# Patient Record
Sex: Male | Born: 1970 | Race: White
Health system: Northeastern US, Academic
[De-identification: ages and names within clinical notes are randomized; demographics above are authoritative.]

---

## 2012-11-08 ENCOUNTER — Ambulatory Visit
Admission: AD | Admit: 2012-11-08 | Discharge: 2012-11-08 | Disposition: A | Discharge status: Home / Self Care | Payer: Self-pay

## 2012-11-08 DIAGNOSIS — K047 Periapical abscess without sinus: Secondary | ICD-10-CM

## 2012-11-08 LAB — HM HIV SCREENING OFFERED

## 2012-11-08 MED ORDER — AMOXICILLIN 500 MG PO CAPS *I*
500.0000 mg | ORAL_CAPSULE | Freq: Three times a day (TID) | ORAL | Status: DC
Start: 2012-11-08 — End: 2013-06-26

## 2012-11-08 MED ORDER — AMOXICILLIN 500 MG PO CAPS *I*
500.0000 mg | ORAL_CAPSULE | Freq: Three times a day (TID) | ORAL | Status: DC
Start: 2012-11-08 — End: 2012-11-08

## 2012-11-08 NOTE — Discharge Instructions (Signed)
Ibuprofen 200 mg, 2-3 tabs every 6 hours with food  Go to [facility] in the morning to be seen in the dental urgent care.  Or you may Go to the [facility] at [facility] tomorrow morning to be seen as a walk in patient  Or you may [facility] tomorrow morning to be seen as a walk in patient  Use peridex solution twice daily, 1 tablespoon, swish in mouth for 30 second and spit.  Take antibiotics as prescribed until gone

## 2012-11-08 NOTE — ED Notes (Signed)
Pt with broken tooth and ? Infection right upper side, pt states yesterday started with facial swelling

## 2012-11-08 NOTE — UC Provider Note (Signed)
History     Chief Complaint   Patient presents with   . Facial Swelling     pt with right side facial swelling started yesterday,  pt with dental pain and infection from broken tooth upper right side      HPI Comments: Pt has right upper tooth infection and this AM awoke with right facial swelling. He has been taking ibuprofen for this  At times but has minimal discomfort now      History provided by:  Patient      History reviewed. No pertinent past medical history.         History reviewed. No pertinent past surgical history.    History reviewed. No pertinent family history.      Social History      reports that he has been smoking Cigarettes.  He has been smoking about 1.00 pack per day. He has never used smokeless tobacco. He reports that he currently engages in sexual activity. He reports that he does not drink alcohol or use illicit drugs.    Living Situation    Questions Responses    Patient lives with Significant Other    Homeless No    Caregiver for other family member No    External Services None    Employment Employed    Domestic Violence Risk No          Review of Systems   Review of Systems   HENT: Positive for facial swelling and dental problem.        Physical Exam     ED Triage Vitals   BP Heart Rate Heart Rate(via Pulse Ox) Resp Temp Temp src SpO2 O2 Device O2 Flow Rate   11/08/12 0904 11/08/12 0904 -- 11/08/12 0904 11/08/12 0904 -- 11/08/12 0904 -- --   118/74 mmHg 81  18 36.8 C (98.2 F)  99 %        Weight           11/08/12 0904           70.308 kg (155 lb)               Physical Exam   Constitutional: He is oriented to person, place, and time. He appears well-developed and well-nourished.   HENT:   Head: Normocephalic.   Mouth/Throat: Abnormal dentition. Dental abscesses and dental caries present. No edematous.       Eyes: EOM are normal. Pupils are equal, round, and reactive to light.   Neck: Neck supple.   Neurological: He is alert and oriented to person, place, and time.   Skin: Skin is  warm and dry.       Medical Decision Making   <EDMDM>    Initial Evaluation:  ED First Provider Contact    Date/Time Event User Comments    11/08/12 [phone] ED Provider First Contact Excell Seltzer Initial Face to Face Provider Contact          Patient seen by me as above    Assessment:  42 y.o., male comes to the Urgent Care Center with right facial swelling from dental abscess and Fx tooth #4    Differential Diagnosis includes Abscess tooth, facial cellulitis              Plan: Doxycycline, peridex, dental f/u      Excell Seltzer, PA

## 2013-06-26 ENCOUNTER — Encounter: Payer: Self-pay | Admitting: Gastroenterology

## 2013-06-26 ENCOUNTER — Other Ambulatory Visit: Payer: Self-pay | Admitting: Gastroenterology

## 2013-06-26 ENCOUNTER — Inpatient Hospital Stay
Admit: 2013-06-26 | Discharge status: Against Medical Advice | Payer: Self-pay | Source: Ambulatory Visit | Admitting: Emergency Medicine

## 2013-06-26 ENCOUNTER — Emergency Department
Admit: 2013-06-26 | Discharge: 2013-06-26 | Disposition: A | Discharge status: Other Acute Inpatient Hospital | Payer: Self-pay | Source: Ambulatory Visit | Attending: Emergency Medicine | Admitting: Emergency Medicine

## 2013-06-26 ENCOUNTER — Encounter: Payer: Self-pay | Admitting: Emergency Medicine

## 2013-06-26 DIAGNOSIS — R079 Chest pain, unspecified: Secondary | ICD-10-CM

## 2013-06-26 DIAGNOSIS — Z720 Tobacco use: Secondary | ICD-10-CM | POA: Diagnosis present

## 2013-06-26 LAB — COMPREHENSIVE METABOLIC PANEL
ALT: 11 U/L (ref 0–50)
AST: 17 U/L (ref 0–50)
Albumin: 4.8 g/dL (ref 3.5–5.2)
Alk Phos: 91 U/L (ref 40–130)
Anion Gap: 11 (ref 7–16)
Bilirubin,Total: 0.2 mg/dL (ref 0.0–1.2)
CO2: 27 mmol/L (ref 20–28)
Calcium: 9.9 mg/dL (ref 9.0–10.3)
Chloride: 103 mmol/L (ref 96–108)
Creatinine: 0.8 mg/dL (ref 0.67–1.17)
GFR,Black: 127 *
GFR,Caucasian: 110 *
Glucose: 88 mg/dL (ref 60–99)
Lab: 14 mg/dL (ref 6–20)
Potassium: 4.3 mmol/L (ref 3.3–5.1)
Sodium: 141 mmol/L (ref 133–145)
Total Protein: 7 g/dL (ref 6.3–7.7)

## 2013-06-26 LAB — CK ISOENZYMES
CK: 166 U/L (ref 46–171)
Mass CKMB: 2.9 ng/mL (ref 0.0–4.9)

## 2013-06-26 LAB — CBC AND DIFFERENTIAL
Baso # K/uL: 0 10*3/uL (ref 0.0–0.1)
Basophil %: 0.6 % (ref 0.2–1.2)
Eos # K/uL: 0.2 10*3/uL (ref 0.0–0.5)
Eosinophil %: 2.7 % (ref 0.8–7.0)
Hematocrit: 45 % (ref 40–51)
Hemoglobin: 15.5 g/dL (ref 13.7–17.5)
Lymph # K/uL: 1.8 10*3/uL (ref 1.3–3.6)
Lymphocyte %: 25.4 % (ref 21.8–53.1)
MCH: 31 pg/cell (ref 26–32)
MCHC: 35 g/dL (ref 32–37)
MCV: 91 fL (ref 79–92)
Mono # K/uL: 0.6 10*3/uL (ref 0.3–0.8)
Monocyte %: 9.1 % (ref 5.3–12.2)
Neut # K/uL: 4.4 10*3/uL (ref 1.8–5.4)
Platelets: 201 10*3/uL (ref 150–330)
RBC: 4.9 MIL/uL (ref 4.6–6.1)
RDW: 13.9 % (ref 11.6–14.4)
Seg Neut %: 62.2 % (ref 34.0–67.9)
WBC: 7.1 10*3/uL (ref 4.2–9.1)

## 2013-06-26 LAB — D-DIMER, QUANTITATIVE: D-Dimer: <0.22 ug/mL FEU (ref 0.00–0.50)

## 2013-06-26 LAB — TROPONIN T
Troponin T: <0.01 ng/mL (ref 0.00–0.02)
Troponin T: <0.01 ng/mL (ref 0.00–0.02)

## 2013-06-26 LAB — HM HIV SCREENING OFFERED

## 2013-06-26 MED ORDER — IBUPROFEN 600 MG PO TABS *I*
600.0000 mg | ORAL_TABLET | Freq: Four times a day (QID) | ORAL | Status: AC | PRN
Start: 2013-06-26 — End: 2013-07-01

## 2013-06-26 MED ORDER — NICOTINE POLACRILEX 2 MG MT LOZG *I*
2.0000 mg | LOZENGE | OROMUCOSAL | Status: AC | PRN
Start: 2013-06-26 — End: 2013-07-11

## 2013-06-26 MED ORDER — ASPIRIN 81 MG PO CHEW *I*
324.0000 mg | CHEWABLE_TABLET | Freq: Once | ORAL | Status: AC
Start: 2013-06-26 — End: 2013-06-26
  Administered 2013-06-26: 324 mg via ORAL
  Filled 2013-06-26: qty 4

## 2013-06-26 MED ORDER — IBUPROFEN 600 MG PO TABS *I*
600.0000 mg | ORAL_TABLET | Freq: Four times a day (QID) | ORAL | Status: DC | PRN
Start: 2013-06-26 — End: 2013-06-27

## 2013-06-26 MED ORDER — NICOTINE POLACRILEX 2 MG MT LOZG *I*
2.0000 mg | LOZENGE | OROMUCOSAL | Status: DC | PRN
Start: 2013-06-26 — End: 2013-06-27

## 2013-06-26 MED ORDER — ACETAMINOPHEN 325 MG PO TABS *I*
650.0000 mg | ORAL_TABLET | ORAL | Status: DC | PRN
Start: 2013-06-26 — End: 2013-06-27

## 2013-06-26 MED ORDER — KETOROLAC TROMETHAMINE 30 MG/ML IJ SOLN *I*
30.0000 mg | Freq: Once | INTRAMUSCULAR | Status: AC
Start: 2013-06-26 — End: 2013-06-26
  Administered 2013-06-26: 30 mg via INTRAVENOUS
  Filled 2013-06-26: qty 1

## 2013-06-26 NOTE — ED Notes (Signed)
Pt arrived in [facility] from [facility] via stretcher.  A&Ox4.  No difficulties ambulating from stretcher to bed.  Wife is at bedside.  Oriented to unit, call bell within place.

## 2013-06-26 NOTE — ED Notes (Signed)
Plan of Care     Serial troponin and EKG, telemetry monitoring, labs, vitals, daily medications, comfort care

## 2013-06-26 NOTE — ED Notes (Signed)
Plan of Care     Serial trop/ekgs, telemetry monitoring, medications as prescribed, activity as tolerated, comfort measures as needed.

## 2013-06-26 NOTE — Discharge Instructions (Signed)
You did not have a heart attack  Your pain is likely related to muscle sprain   Take ibuprofen as needed   You are scheduled for a stress test of your heart on Monday at [facility] [address]   See an eye doctor in regard to your vision in follow up  Try not to smoke  Use the nicorette lozenges as needed  Return if worse or change

## 2013-06-26 NOTE — ED Notes (Signed)
Plan: monitor, medication administration/labs/imaging per provider order, comfort measures, teaching, VS q4hr, and reassess.

## 2013-06-26 NOTE — ED Notes (Signed)
Pt presents with left sided chest pain x one week but today started with left arm pain started one hour ago. denies cardiac history. denies SOB/diaphoresis

## 2013-06-26 NOTE — ED Provider Notes (Signed)
History     Chief Complaint   Patient presents with    Chest Pain     Pt presents with left sided chest pain x one week but today started with left arm pain started one hour ago. denies cardiac history. denies SOB/diaphoresis     HPI Comments: 43 year old gentleman with about a week off chest discomfort on the left side, comes and goes but became more constant few days ago.  Described as 5/10 currently radiating into the left arm since about an hour ago.  Mild no nausea, does get reproduced with deep breath and palpation of the chest, however patient denies any injury or trauma.  It is described as sharp.  The pain in the arm is achy.  Not associated with any arm movement.  No loss of sensation or power.  No shortness of breath no diarrhea no vomiting and no diaphoresis.  Good fluids otherwise.  No abdominal pain.  No calf swelling or tenderness.    Patient does smoke and has a family history of heart troubles in the 3s with his father.  Does not have a primary care physician but according to the patient has been in good health otherwise.  No history of diabetes, hypertension or cholesterol or other conditions according to the patient.      History reviewed. No pertinent past medical history.         History reviewed. No pertinent past surgical history.    History reviewed. No pertinent family history.      Social History      reports that he has been smoking Cigarettes.  He has been smoking about 1.00 pack per day. He has never used smokeless tobacco. He reports that he currently engages in sexual activity. He reports that he does not drink alcohol or use illicit drugs.    Living Situation    Questions Responses    Patient lives with Significant Other    Homeless No    Caregiver for other family member No    External Services None    Employment Employed    Domestic Violence Risk No          Problem List     There is no problem list on file for this patient.      Review of Systems   Review of Systems    Constitutional: Negative for fever, chills and unexpected weight change.   HENT: Negative for nosebleeds, neck pain and neck stiffness.    Eyes: Negative for photophobia and pain.   Respiratory: Negative for cough, chest tightness, shortness of breath, wheezing and stridor.    Cardiovascular: Positive for chest pain. Negative for palpitations and leg swelling.        See HPI   Gastrointestinal: Positive for nausea. Negative for vomiting, abdominal pain and blood in stool.   Genitourinary: Negative for frequency, flank pain, difficulty urinating and testicular pain.   Musculoskeletal: Negative for myalgias and arthralgias.        See HPI   Skin: Negative for pallor.   Neurological: Positive for dizziness (generalized lightheaded at times throughout the week.). Negative for seizures, syncope, weakness, light-headedness and headaches.   Psychiatric/Behavioral: Negative for suicidal ideas, behavioral problems and self-injury.   All other systems reviewed and are negative.        Physical Exam     ED Triage Vitals   BP Pulse Heart Rate(via Pulse Ox) Resp Temp Temp src SpO2 O2 Device O2 Flow Rate   06/26/13 1114 --  06/26/13 1114 06/26/13 1114 06/26/13 1114 -- 06/26/13 1114 06/26/13 1114 --   156/82 mmHg  81 20 36.4 C (97.5 F)  98 % None (Room air)       Weight           06/26/13 1114           63.504 kg (140 lb)               Physical Exam   Nursing note and vitals reviewed.  Constitutional: He is oriented to person, place, and time. He appears well-developed and well-nourished. He appears distressed (mild distress, worse and reproduced with palpation of the left chest wall with the same.).   HENT:   Head: Normocephalic and atraumatic.   Eyes: Pupils are equal, round, and reactive to light.   Neck: Normal range of motion. Neck supple.   Cardiovascular: Normal rate, regular rhythm and normal heart sounds.    Pulmonary/Chest: Effort normal and breath sounds normal. No respiratory distress. He has no wheezes. He has no  rales. He exhibits tenderness (as above, reproducible sharp chest wall tenderness on the left side.).   Abdominal: Soft. Bowel sounds are normal. There is no tenderness.   Musculoskeletal: Normal range of motion. He exhibits no edema and no tenderness.   No calf tender/swelling   Neurological: He is alert and oriented to person, place, and time. No cranial nerve deficit.   No focal neurological deficits   Skin: Skin is warm and dry.   Psychiatric: He has a normal mood and affect. Judgment normal.       Medical Decision Making   <EDMDM>  EKG with normal sinus rhythm, right bundle branch block at 83.  No other acute changes.  Recent Results (from the past 24 hour(s))   HM HIV SCREENING OFFERED    Collection Time     06/26/13 12:00 AM       Result Value Range    HM HIV SCREENING OFFERED Declined     CBC AND DIFFERENTIAL    Collection Time     06/26/13 11:39 AM       Result Value Range    WBC 7.1  4.2 - 9.1 THOU/uL    RBC 4.9  4.6 - 6.1 MIL/uL    Hemoglobin 15.5  13.7 - 17.5 g/dL    Hematocrit 45  40 - 51 %    MCV 91  79 - 92 fL    MCH 31  26 - 32 pg/cell    MCHC 35  32 - 37 g/dL    RDW 13.9  11.6 - 14.4 %    Platelets 201  150 - 330 THOU/uL    Seg Neut % 62.2  34.0 - 67.9 %    Lymphocyte % 25.4  21.8 - 53.1 %    Monocyte % 9.1  5.3 - 12.2 %    Eosinophil % 2.7  0.8 - 7.0 %    Basophil % 0.6  0.2 - 1.2 %    Neut # K/uL 4.4  1.8 - 5.4 THOU/uL    Lymph # K/uL 1.8  1.3 - 3.6 THOU/uL    Mono # K/uL 0.6  0.3 - 0.8 THOU/uL    Eos # K/uL 0.2  0.0 - 0.5 THOU/uL    Baso # K/uL 0.0  0.0 - 0.1 THOU/uL   COMPREHENSIVE METABOLIC PANEL    Collection Time     06/26/13 11:39 AM       Result Value Range  Sodium 141  133 - 145 mmol/L    Potassium 4.3  3.3 - 5.1 mmol/L    Chloride 103  96 - 108 mmol/L    CO2 27  20 - 28 mmol/L    Anion Gap 11  7 - 16    UN 14  6 - 20 mg/dL    Creatinine 0.80  0.67 - 1.17 mg/dL    GFR,Caucasian 110      GFR,Black 127      Glucose 88  60 - 99 mg/dL    Calcium 9.9  9.0 - 10.3 mg/dL    Total Protein 7.0   6.3 - 7.7 g/dL    Albumin 4.8  3.5 - 5.2 g/dL    Bilirubin,Total 0.2  0.0 - 1.2 mg/dL    AST 17  0 - 50 U/L    ALT 11  0 - 50 U/L    Alk Phos 91  40 - 130 U/L   TROPONIN T    Collection Time     06/26/13 11:39 AM       Result Value Range    Troponin T <0.01  0.00 - 0.02 ng/mL   CK ISOENZYMES    Collection Time     06/26/13 11:39 AM       Result Value Range    CK 166  46 - 171 U/L    Mass CKMB 2.9  0.0 - 4.9 ng/mL   D-DIMER, QUANTITATIVE    Collection Time     06/26/13 11:39 AM       Result Value Range    D-Dimer <0.22  0.00 - 0.50 ug/mL FEU     Preliminary chest x-ray read as negative  Initial Evaluation:  ED First Provider Contact    Date/Time Event User Comments    06/26/13 1126 ED Provider First Contact Askari Kinley, [facility] Initial Face to Face Provider Contact          Patient seen by me as above    Assessment:  43 y.o., male comes to the ED with atypical chest pain, reproducible however smoker and family history with young age.  No previous cardiac workup.    Differential Diagnosis includes consideration for acute cardiac syndrome.  Consideration for unlikely pneumonia, consideration for pulmonary embolus as well as other causes of chest pain.  Unlikely epigastric such as reflux or ulcer.               Plan: In the emergency room started with aspirin and Toradol.  Feeling better with medication.  Discussed with patient regarding the options including observation in one of the hospitals.  Also discussed about the option of a second troponin.  Including pros and cons and that the complete workup is in observation.    Discussed with patient as well as Dr. Gerrit Halls at [facility] and patient accepted for observation.  At 1 PM.      Massie Kluver, MD          Massie Kluver, MD  06/26/13 [phone]

## 2013-06-26 NOTE — Progress Notes (Signed)
Utilization Management    Level of Care Observation service as of the date 06/26/2013    Pt notified of Observation level of care.  Copy of notification given to patient and copy placed in chartlet.      Marcus KaufmanAlyssa Xena Montoya     Pager: [phone]

## 2013-06-26 NOTE — ED Obs Notes (Addendum)
ED OBSERVATION ADMISSION NOTE    Patient seen by me today, 06/26/2013 at 2:32 PM    Current patient status: Observation    History   No chief complaint on file.    Patient is a 43 y.o. male presenting with chest pain.   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  Chest Pain  Pain location:  Substernal area  Pain quality: pressure    Pain radiates to the back: no    Pain severity:  Moderate  Duration:  7 days  Timing:  Constant  Progression:  Waxing and waning  Chronicity:  New  Relieved by:  Nothing  Worsened by:  Nothing tried  Ineffective treatments:  None tried  Associated symptoms: no abdominal pain, no anorexia, no anxiety, no back pain, no cough, no diaphoresis, no fever, no nausea, no near-syncope, no numbness, no orthopnea, no palpitations, no shortness of breath, no syncope and not vomiting    Risk factors: male sex and smoking    States that he does a lot of heavy lifting at work but can not note a specific example     No past medical history on file.    No past surgical history on file.    No family history on file.    Social History      reports that he has been smoking Cigarettes.  He has been smoking about 1.00 pack per day. He has never used smokeless tobacco. He reports that he currently engages in sexual activity. He reports that he does not drink alcohol or use illicit drugs.    Living Situation    Questions Responses    Patient lives with Significant Other    Homeless No    Caregiver for other family member No    External Services None    Employment Employed    Domestic Violence Risk No          Review of Systems   Review of Systems   Constitutional: Negative for fever and diaphoresis.   Respiratory: Negative for cough and shortness of breath.    Cardiovascular: Positive for chest pain. Negative for palpitations, orthopnea, syncope and near-syncope.   Gastrointestinal: Negative for nausea, vomiting,  abdominal pain and anorexia.   Musculoskeletal: Negative for back pain.   Neurological: Negative for numbness.       Physical Exam   There were no vitals taken for this visit.    Physical Exam   Constitutional: He is oriented to person, place, and time. He appears well-developed and well-nourished.   HENT:   Head: Normocephalic.   Neck: Neck supple.   Cardiovascular: Normal rate, regular rhythm and normal heart sounds.  Exam reveals no gallop and no friction rub.    No murmur heard.  Pulmonary/Chest: Effort normal and breath sounds normal.   Abdominal: Soft. He exhibits no distension. There is no tenderness.   Musculoskeletal: Normal range of motion. He exhibits no edema.   Neurological: He is alert and oriented to person, place, and time.   Skin: Skin is warm and dry.   Psychiatric: He has a normal mood and affect. His behavior is normal. Judgment and thought content normal.     Heent: pupils equal with visual acuity grossly intact  perl bilateral   Fundi without abnormalities seen  Neck: without jvd   Heart: reg s1 s2   Lungs: clear abdomen: soft  ext: without edema   Chest with palpatory discomfort midsternal  Slight posterior chest wall discomfort as well     Tests    EKG:  Nsr    rbbb poor r progression ivcd otherwise nonspecific     Labs:   All labs in the last 24 hours:   Recent Results (from the past 24 hour(s))   HM HIV SCREENING OFFERED    Collection Time     06/26/13 12:00 AM       Result Value Range    HM HIV SCREENING OFFERED Declined     CBC AND DIFFERENTIAL    Collection Time     06/26/13 11:39 AM       Result Value Range    WBC 7.1  4.2 - 9.1 THOU/uL    RBC 4.9  4.6 - 6.1 MIL/uL    Hemoglobin 15.5  13.7 - 17.5 g/dL    Hematocrit 45  40 - 51 %    MCV 91  79 - 92 fL    MCH 31  26 - 32 pg/cell    MCHC 35  32 - 37 g/dL    RDW 13.9  11.6 - 14.4 %    Platelets 201  150 - 330 THOU/uL    Seg Neut % 62.2  34.0 - 67.9 %    Lymphocyte % 25.4  21.8 - 53.1 %    Monocyte % 9.1  5.3 - 12.2 %    Eosinophil % 2.7  0.8  - 7.0 %    Basophil % 0.6  0.2 - 1.2 %    Neut # K/uL 4.4  1.8 - 5.4 THOU/uL    Lymph # K/uL 1.8  1.3 - 3.6 THOU/uL    Mono # K/uL 0.6  0.3 - 0.8 THOU/uL    Eos # K/uL 0.2  0.0 - 0.5 THOU/uL    Baso # K/uL 0.0  0.0 - 0.1 THOU/uL   COMPREHENSIVE METABOLIC PANEL    Collection Time     06/26/13 11:39 AM       Result Value Range    Sodium 141  133 - 145 mmol/L    Potassium 4.3  3.3 - 5.1 mmol/L    Chloride 103  96 - 108 mmol/L    CO2 27  20 - 28 mmol/L    Anion Gap 11  7 - 16    UN 14  6 - 20 mg/dL    Creatinine 0.80  0.67 - 1.17 mg/dL    GFR,Caucasian 110      GFR,Black 127      Glucose 88  60 - 99 mg/dL    Calcium 9.9  9.0 - 10.3 mg/dL    Total Protein 7.0  6.3 - 7.7 g/dL    Albumin 4.8  3.5 - 5.2 g/dL    Bilirubin,Total 0.2  0.0 - 1.2 mg/dL    AST 17  0 - 50 U/L    ALT 11  0 - 50 U/L    Alk Phos 91  40 - 130 U/L   TROPONIN T    Collection Time     06/26/13 11:39 AM       Result Value Range    Troponin T <0.01  0.00 - 0.02 ng/mL   CK ISOENZYMES    Collection Time     06/26/13 11:39 AM       Result Value Range    CK 166  46 - 171 U/L    Mass CKMB  2.9  0.0 - 4.9 ng/mL   D-DIMER, QUANTITATIVE    Collection Time     06/26/13 11:39 AM       Result Value Range    D-Dimer <0.22  0.00 - 0.50 ug/mL FEU        Imaging: * Portable Chest Standard Ap Single View    06/26/2013   Exam Site: [facility], [place]  06/26/2013 11:59 AM PORTABLE CHEST SINGLE VIEW   ORDERING CLINICAL INFORMATION:  ERECORD: cp ADDITIONAL CLINICAL INFORMATION:  None.   COMPARISON:  None.   FINDINGS:   Tubes and Catheters: None.   Central Airways:  Normal.   Lungs: No acute interstitial or airspace disease.   Heart and Mediastinum: Cardiomediastinal silhouette within normal  limits.   Pleura/Pleural space: No pleural effusion. No pneumothorax.   Bones and soft tissues: No acute osseous abnormalities.      06/26/2013   IMPRESSION:   No acute cardiopulmonary disease.   END REPORT     I have personally reviewed the image(s) and the  resident's  interpretation and agree with or edited the findings.       Medical Decision Making      Amount and/or Complexity of Data Reviewed  Clinical lab tests: reviewed  Tests in the radiology section of CPT: reviewed  Tests in the medicine section of CPT: reviewed  Discussion of test results with the performing providers: yes  Decide to obtain previous medical records or to obtain history from someone other than the patient: yes  Obtain history from someone other than the patient: yes  Review and summarize past medical records: yes  Discuss the patient with other providers: yes  Independent visualization of images, tracings, or specimens: yes        Assessment:  43 y.o., male placed in OBS after evaluation in the ED for  Chest pain ro mi: ongoing rule out for mi by serial troponins and ecg's  ( troponin negative x 1 with negative d dimer )  The chest pain is clearly elicited by palpation likely musculoskeletal   Doubt pulmonary embolism   Doubt dissection   Start ibuprofen     Differential Diagnosis includes     Tobaccoioism: on nicotine replacement     Noted transient diplopia which resolved : advised to f/u with eye doctor     Note of rbbb on ecg: ongoing rule out for mi with stress echo as an outpatient on Monday           Plan: ongoing rule out for mi by serial troponins and ecg's   We will schedule for an outpatient stress test on Monday as the patient declined to stay overnight   Patient advised to stay overnight for observation but declined   Medically preferred DVT prophylaxis: None      Trudie Cervantes Leone Payor, MD

## 2013-06-27 NOTE — ED Obs Notes (Signed)
ED OBSERVATION FOLLOW-UP NOTE    Patient:  Marcus Montoya  Patient states he does not do well in hospitals, states he is not able to stay for his 3 rd blood draw. Patient refuses blood draw at midnight.  Reviewed dangers of heart attack, death, possible brain death, patient signed out AMA, discharge paper given.  Patient has been pain free and stable.  Girlfriend here encouraging patient to leave.    Author: Corrie DandyMARY ANN Valarie ConesWEBER, NP  Note created: 06/27/2013  at: 12:26 AM

## 2013-06-28 ENCOUNTER — Ambulatory Visit: Payer: Self-pay

## 2013-06-28 ENCOUNTER — Other Ambulatory Visit: Payer: Self-pay | Admitting: Emergency Medicine

## 2013-06-28 LAB — EKG 12-LEAD
P: 70 degrees
P: 69 degrees
QRS: 24 degrees
QRS: 16 degrees
Rate: 83 {beats}/min
Rate: 64 {beats}/min
Severity: ABNORMAL
Severity: ABNORMAL
Severity: ABNORMAL
Severity: ABNORMAL
T: 17 degrees
T: 12 degrees

## 2013-06-28 NOTE — Progress Notes (Signed)
Patient is here for a stress echo. Instructed patient to resume medications and to follow up with referring provider. Patient verbalized understanding.  Babs Dabbs A Faiza Bansal, RN

## 2013-06-29 ENCOUNTER — Telehealth: Payer: Self-pay | Admitting: Geriatric Medicine

## 2013-06-29 NOTE — Telephone Encounter (Signed)
[facility]    Patient progress: Patient feeling the same    Any questions or concerns: Have not make an appointment     Follow-up Reminded patient it is important to follow up with PCP and to call if there is any acute problem.     Marcus CottonLUKE Kyia Rhude, MD, 1:24 PM

## 2015-03-09 ENCOUNTER — Emergency Department
Admission: EM | Admit: 2015-03-09 | Discharge: 2015-03-09 | Disposition: A | Discharge status: Home / Self Care | Payer: Self-pay | Source: Ambulatory Visit | Attending: Emergency Medicine | Admitting: Emergency Medicine

## 2015-03-09 DIAGNOSIS — S51812A Laceration without foreign body of left forearm, initial encounter: Secondary | ICD-10-CM

## 2015-03-09 LAB — HM HIV SCREENING OFFERED

## 2015-03-09 MED ORDER — TETANUS-DIPHTH-ACELL PERT 5-2.5-18.5 LF-MCG/0.5 IM SUSP *WRAPPED*
0.5000 mL | Freq: Once | INTRAMUSCULAR | Status: AC
Start: 2015-03-09 — End: 2015-03-09

## 2015-03-09 MED ORDER — TETANUS-DIPHTH-ACELL PERT 5-2.5-18.5 LF-MCG/0.5 IM SUSP *WRAPPED*
INTRAMUSCULAR | Status: AC
Start: 2015-03-09 — End: 2015-03-09
  Administered 2015-03-09: 0.5 mL via INTRAMUSCULAR
  Filled 2015-03-09: qty 0.5

## 2015-03-09 NOTE — ED Notes (Signed)
Pt with c/o laceration lt arm. Bleeding controled pt cut arm on piece of sheet metal.  No other injuries.  Laceration 6.5 cm.

## 2015-03-09 NOTE — ED Procedure Documentation (Signed)
Procedures   Laceration repair  Date/Time: 03/09/2015 4:55 PM  Performed by: Zollie Beckers  Authorized by: Zollie Beckers   Consent: Verbal consent obtained.  Risks and benefits: risks, benefits and alternatives were discussed  Consent given by: patient  Patient identity confirmed: verbally with patient and arm band  Body area: upper extremity  Location details: right lower arm  Laceration length: 5 cm  Tendon involvement: none  Nerve involvement: none  Vascular damage: no  Anesthesia: local infiltration    Anesthesia:  Anesthesia: local infiltration  Local Anesthetic: lidocaine 1% without epinephrine   Anesthetic total: 2 mL  Irrigation solution: saline  Irrigation method: tap  Amount of cleaning: standard  Skin closure: staples  Number of sutures: 7  Technique: interrupted  Approximation: close  Dressing: 4x4 sterile gauze and antibiotic ointment  Patient tolerance: Patient tolerated the procedure well with no immediate complications          Zollie Beckers, MD     Zollie Beckers, MD  03/09/15 1655

## 2015-03-09 NOTE — Discharge Instructions (Signed)
Review your home care/return instructions.    Take Tylenol and or Motrin/Ibuprofen as needed/directed for pain.

## 2015-03-09 NOTE — ED Provider Notes (Signed)
History     Chief Complaint   Patient presents with    Laceration       HPI Comments: Billye Nydam is a 44 y.o. male with no relevant past medical history here for left forearm laceration after cutting his arm on a piece of sheet metal.  He denies any other injuries.      History provided by:  Patient      History reviewed. No pertinent past medical history.         History reviewed. No pertinent past surgical history.    No family history on file.    Social History    reports that he has been smoking Cigarettes.  He has been smoking about 1.00 pack per day. He has never used smokeless tobacco. He reports that he currently engages in sexual activity and has had male partners. He reports that he does not drink alcohol or use illicit drugs.    Living Situation     Questions Responses    Patient lives with Significant Other    Homeless No    Caregiver for other family member No    External Services None    Employment Employed    Domestic Violence Risk No          Problem List     Patient Active Problem List   Diagnosis Code    Chest pain R07.9    Tobacco abuse Z72.0       Review of Systems   Review of Systems   Constitutional: Negative for fatigue.   Skin: Positive for wound.   Allergic/Immunologic: Negative for immunocompromised state.   Neurological: Negative for light-headedness.   Hematological: Does not bruise/bleed easily.       Physical Exam     ED Triage Vitals   BP Pulse Heart Rate (via Pulse Ox) Resp Temp Temp src SpO2 O2 Device O2 Flow Rate   03/09/15 1630 -- 03/09/15 1630 03/09/15 1630 03/09/15 1630 -- 03/09/15 1630 03/09/15 1630 --   142/85  95 18 37.7 C (99.9 F)  97 % None (Room air)       Weight           03/09/15 1630           70.3 kg (155 lb)               Physical Exam   Constitutional: He appears well-developed and well-nourished.   Cardiovascular: Normal rate.    Pulmonary/Chest: Effort normal.   Musculoskeletal: Normal range of motion.   Full flexion, extension, radial and ulnar  deviation of the forearm on the left   Neurological: He is alert.   No sensory loss   Skin: Skin is warm.   5 cm linear laceration over the mid dorsal forearm   Psychiatric: He has a normal mood and affect.   Nursing note and vitals reviewed.      Medical Decision Making      Amount and/or Complexity of Data Reviewed  Review and summarize past medical records: yes        Initial Evaluation:  ED First Provider Contact     Date/Time Event User Comments    03/09/15 1639 ED Provider First Contact Audery Amel J Initial Face to Face Provider Contact          Patient seen by me today 03/09/2015 at 1640    Assessment:  44 y.o.male comes to the ED with forearm laceration    Differential Diagnosis includes forearm  laceration, unlikely neurovascular or tendinous injury                   Plan: Patient presents with simple forearm laceration with intact physical exam.  His tetanus has been updated in the emergency department.  His laceration has been clean, staples, and wound care provided.    He will follow-up in one week for staple removal.    Zollie Beckers, MD             Zollie Beckers, MD  03/09/15 1655

## 2015-03-09 NOTE — ED Notes (Signed)
Plan of Care     Nursing Care Plan:  Will monitor and assess VS and pain scores every 2-4 hours and prn.  Perform frequent rounding prn.  Provide updates to patient and/or cargiver frequently.  Provide support to patient/caregiver as needed.  Teach patient and/or caregivers about patients needs/status working towards discharge.  Patient oriented to room and given call bell.

## 2015-03-09 NOTE — ED Triage Notes (Signed)
Laceration to left forearm - approx 5 cm  caused by a piece of metal at 1530 today. Not up to date on Tetanus .        Triage Note   Artis Flock, RN

## 2016-02-12 ENCOUNTER — Encounter: Payer: Self-pay | Admitting: Emergency Medicine

## 2016-02-12 ENCOUNTER — Emergency Department
Admission: EM | Admit: 2016-02-12 | Discharge: 2016-02-12 | Disposition: A | Discharge status: Other Acute Inpatient Hospital | Payer: Self-pay | Source: Ambulatory Visit | Attending: Emergency Medicine | Admitting: Emergency Medicine

## 2016-02-12 ENCOUNTER — Emergency Department
Admission: EM | Admit: 2016-02-12 | Disposition: A | Discharge status: Home / Self Care | Payer: Self-pay | Source: Ambulatory Visit | Attending: Emergency Medicine | Admitting: Emergency Medicine

## 2016-02-12 DIAGNOSIS — M795 Residual foreign body in soft tissue: Secondary | ICD-10-CM

## 2016-02-12 LAB — CBC AND DIFFERENTIAL
Baso # K/uL: 0.1 10*3/uL (ref 0.0–0.1)
Basophil %: 0.6 %
Eos # K/uL: 0.2 10*3/uL (ref 0.0–0.5)
Eosinophil %: 2.2 %
Hematocrit: 43 % (ref 40–51)
Hemoglobin: 14.8 g/dL (ref 13.7–17.5)
Lymph # K/uL: 2.6 10*3/uL (ref 1.3–3.6)
Lymphocyte %: 31.8 %
MCH: 32 pg/cell (ref 26–32)
MCHC: 35 g/dL (ref 32–37)
MCV: 92 fL (ref 79–92)
Mono # K/uL: 0.7 10*3/uL (ref 0.3–0.8)
Monocyte %: 9 %
Neut # K/uL: 4.6 10*3/uL (ref 1.8–5.4)
Platelets: 206 10*3/uL (ref 150–330)
RBC: 4.7 MIL/uL (ref 4.6–6.1)
RDW: 14 % (ref 11.6–14.4)
Seg Neut %: 56.4 %
WBC: 8.2 10*3/uL (ref 4.2–9.1)

## 2016-02-12 LAB — BASIC METABOLIC PANEL
Anion Gap: 11 (ref 7–16)
CO2: 23 mmol/L (ref 20–28)
Calcium: 9.4 mg/dL (ref 8.6–10.2)
Chloride: 104 mmol/L (ref 96–108)
Creatinine: 0.99 mg/dL (ref 0.67–1.17)
GFR,Black: 106 *
GFR,Caucasian: 91 *
Glucose: 101 mg/dL — ABNORMAL HIGH (ref 60–99)
Lab: 17 mg/dL (ref 6–20)
Potassium: 4.1 mmol/L (ref 3.3–5.1)
Sodium: 138 mmol/L (ref 133–145)

## 2016-02-12 LAB — PROTIME-INR
INR: 1 (ref 0.9–1.1)
Protime: 11.6 s (ref 10.0–12.9)

## 2016-02-12 LAB — APTT: aPTT: 26 s (ref 25.8–37.9)

## 2016-02-12 MED ORDER — AMOXICILLIN-POT CLAVULANATE 875-125 MG PO TABS *I*
1.0000 | ORAL_TABLET | Freq: Two times a day (BID) | ORAL | 0 refills | Status: AC
Start: 2016-02-12 — End: 2016-02-17

## 2016-02-12 MED ORDER — HYDROMORPHONE HCL PF 1 MG/ML IJ SOLN *WRAPPED*
1.0000 mg | Freq: Once | INTRAMUSCULAR | Status: AC
Start: 2016-02-12 — End: 2016-02-12

## 2016-02-12 MED ORDER — LIDOCAINE HCL 2 % (PF) IJ SOLN *I*
10.0000 mg | Freq: Once | INTRAMUSCULAR | Status: DC
Start: 2016-02-12 — End: 2016-02-12
  Filled 2016-02-12: qty 2

## 2016-02-12 MED ORDER — OXYCODONE-ACETAMINOPHEN 5-325 MG PO TABS *I*
1.0000 | ORAL_TABLET | ORAL | 0 refills | Status: AC | PRN
Start: 2016-02-12 — End: 2016-02-15

## 2016-02-12 MED ORDER — LIDOCAINE HCL 2 % IJ SOLN *I*
INTRAMUSCULAR | Status: DC
Start: 2016-02-12 — End: 2016-02-12
  Filled 2016-02-12: qty 20

## 2016-02-12 MED ORDER — ONDANSETRON HCL 2 MG/ML IV SOLN *I*
INTRAMUSCULAR | Status: AC
Start: 2016-02-12 — End: 2016-02-12
  Administered 2016-02-12: 4 mg via INTRAVENOUS
  Filled 2016-02-12: qty 2

## 2016-02-12 MED ORDER — HYDROMORPHONE HCL PF 1 MG/ML IJ SOLN *WRAPPED*
1.0000 mg | Freq: Once | INTRAMUSCULAR | Status: AC
Start: 2016-02-12 — End: 2016-02-12
  Administered 2016-02-12: 1 mg via INTRAVENOUS
  Filled 2016-02-12: qty 1

## 2016-02-12 MED ORDER — BUPIVACAINE HCL 0.5 % IJ SOLUTION *WRAPPED*
INTRAMUSCULAR | Status: DC
Start: 2016-02-12 — End: 2016-02-12
  Filled 2016-02-12: qty 10

## 2016-02-12 MED ORDER — MORPHINE SULFATE 4 MG/ML IV SOLN *WRAPPED*
4.0000 mg | INTRAVENOUS | Status: DC | PRN
Start: 2016-02-12 — End: 2016-02-12
  Administered 2016-02-12 (×3): 4 mg via INTRAVENOUS
  Filled 2016-02-12 (×3): qty 1

## 2016-02-12 MED ORDER — BUPIVACAINE HCL 0.5 % IJ SOLUTION *WRAPPED*
10.0000 mL | Freq: Once | INTRAMUSCULAR | Status: DC
Start: 2016-02-12 — End: 2016-02-12

## 2016-02-12 MED ORDER — LIDOCAINE HCL 1 % IJ SOLN *I*
INTRAMUSCULAR | Status: AC
Start: 2016-02-12 — End: 2016-02-12
  Administered 2016-02-12: 10 mL via SUBCUTANEOUS
  Filled 2016-02-12: qty 20

## 2016-02-12 MED ORDER — HYDROMORPHONE HCL PF 1 MG/ML IJ SOLN *WRAPPED*
INTRAMUSCULAR | Status: AC
Start: 2016-02-12 — End: 2016-02-12
  Administered 2016-02-12: 1 mg via INTRAVENOUS
  Filled 2016-02-12: qty 1

## 2016-02-12 MED ORDER — ONDANSETRON HCL 2 MG/ML IV SOLN *I*
4.0000 mg | Freq: Once | INTRAMUSCULAR | Status: AC
Start: 2016-02-12 — End: 2016-02-12

## 2016-02-12 MED ORDER — LIDOCAINE HCL 1 % IJ SOLN *I*
10.0000 mL | Freq: Once | INTRAMUSCULAR | Status: AC
Start: 2016-02-12 — End: 2016-02-12

## 2016-02-12 MED ORDER — SODIUM CHLORIDE 0.9 % IV BOLUS *I*
1000.0000 mL | Freq: Once | Status: AC
Start: 2016-02-12 — End: 2016-02-12
  Administered 2016-02-12: 1000 mL via INTRAVENOUS

## 2016-02-12 NOTE — ED Notes (Signed)
Patient up and ambulatory without assistance. Patient discharge instructions reviewed. Patient is comfortable with discharge planning and verbalizes understanding. Belongings with patient and is safe to discharge at this time. Patient provided with extra dressing supplies for wound. Patients wife is driving home.

## 2016-02-12 NOTE — Discharge Instructions (Signed)
You were seen in the emergency department for a foreign body in your arm which was removed.  You are being prescribed antibiotics.  It is extremely important that you take the full course of antibiotics prescribed, even if you are feeling better before the prescription is finished.  Failing to do so can lead to return of an infection as well as contribution to antibiotic resistant organisms.    Use ice and elevation for pain control and a prescription for pain medication was sent to your pharmacy.  You may use your arm as tolerated, allow the wound to drain and change your dressings daily.    Keep the wound clean, covered, and dry for 48 hours after that you may wash the area with regular soap and water.  Do not soak the wound for any extended period of time.  If the wound becomes very red, swollen, painful, or has pus drainage return to the emergency department immediately for reevaluation.      Call orthopedics tomorrow to schedule a follow up in 5-7 days to confirm improvement in your symptoms, to discuss today's visit, and to address any of your ongoing concerns.

## 2016-02-12 NOTE — ED Notes (Signed)
[facility] called for transport.

## 2016-02-12 NOTE — ED Provider Notes (Signed)
History     Chief Complaint   Patient presents with    Foreign Body     HPI Comments: Marcus Montoya is a 45 y.o. male previously healthy now presenting with right wrist foreign body.  Patient reports that about 15 minutes prior to arrival he nailed his right wrist with a nail gun.  Patient now has a 3 inch nail inside the right wrist just medial to the radial artery.  The nail is slanted proximally and angulated towards the carpal tunnel area.  Patient denies any sensation deficits, however he has significant difficulty with flexion and extension of the fingers possibly due to pain.  Patient reports that tetanus is UTD.  Not on any meds.  No medical history.  Patient then came to [facility] Emergency Department for further management and care.  Is a smoker.        History provided by:  Patient  Language interpreter used: No      History reviewed. No pertinent past medical history.     History reviewed. No pertinent surgical history.  History reviewed. No pertinent family history.    Social History    reports that he has been smoking Cigarettes.  He has been smoking about 1.00 pack per day. He has never used smokeless tobacco. He reports that he drinks alcohol. He reports that he currently engages in sexual activity and has had male partners. He reports that he does not use illicit drugs.    Living Situation     Questions Responses    Patient lives with Significant Other    Homeless No    Caregiver for other family member No    External Services None    Employment Employed    Domestic Violence Risk No          Problem List     Patient Active Problem List   Diagnosis Code    Chest pain R07.9    Tobacco abuse Z72.0       Review of Systems   Review of Systems   Constitutional: Negative for fever.   Eyes: Negative for pain.   Respiratory: Negative for shortness of breath.    Cardiovascular: Negative for chest pain.   Gastrointestinal: Negative for abdominal pain.   Genitourinary: Negative for dysuria.    Musculoskeletal: Positive for arthralgias and joint swelling. Negative for back pain and neck pain.   Skin: Negative for rash.   Neurological: Negative for headaches.   Hematological: Negative for adenopathy.   Psychiatric/Behavioral: Negative for agitation and behavioral problems.       Physical Exam     ED Triage Vitals   BP Heart Rate Heart Rate (via Pulse Ox) Resp Temp Temp src SpO2 O2 Device O2 Flow Rate   02/12/16 1012 02/12/16 1012 -- 02/12/16 1012 02/12/16 1012 02/12/16 1012 02/12/16 1012 02/12/16 1012 --   82/46 43  16 35.4 C (95.7 F) TEMPORAL 97 % None (Room air)       Weight           02/12/16 1012           68 kg (150 lb)                    Physical Exam   Constitutional: He is oriented to person, place, and time. He appears well-developed and well-nourished.   Appears diaphoretic, very uncomfortable, anxious, nauseous   HENT:   Head: Normocephalic and atraumatic.   Eyes: Conjunctivae are normal.  Neck: Neck supple.   Cardiovascular:   bradycardic   Pulmonary/Chest: Effort normal.   Abdominal: Soft.   Musculoskeletal:   On the right wrist area, just medial to the radial artery, there is a 3 inch nail in this area, only toe very edge of the nail is actually visible externally, the shaft of the nail is completely embedded inside the wrist area, the nail is obliquely oriented and running proximally, possibly crossing midline.  There is a strong radial and ulnar pulse, there is intact sensation.  There is very limited flexion of the wrist and fingers possibly due to pain.   Neurological: He is alert and oriented to person, place, and time. He exhibits normal muscle tone.   Skin: Skin is warm and dry.   Psychiatric: He has a normal mood and affect.   Nursing note and vitals reviewed.      Medical Decision Making      Amount and/or Complexity of Data Reviewed  Clinical lab tests: ordered and reviewed  Tests in the radiology section of CPT: ordered and reviewed        Initial Evaluation:  ED First  Provider Contact     Date/Time Event User Comments    02/12/16 1014 ED Provider First Contact Delisha Peaden Initial Face to Face Provider Contact          Patient seen by me as above    Assessment:  45 y.o.male comes to the ED with nail foreign body on the wrist, 3 inch nail, inserted just medial to the radial nerve and obliquely oriented proximally, no sensation deficits, good radial and ulnar pulse, there is greatly diminished ROM due to pain    Differential Diagnosis includes foreign body on the wrist, median nerve laceration, tendon laceration, lower suspicion for major arterial injury given exam findings.                   Plan:   Zofran for nausea, dilaudid 1mg , bolus of fluids 1L  Forearm x ray, wrist x ray  CBC and diff, BMP, PT INR aPTT    Labs pending, x-ray does show a very long foreign body almost 7 cm penetrating into the volar forearm on the right side.  Given how deep this extends, will have patient be transferred to [facility] emergency department for evaluation by orthopedics for foreign body removal.  Is better controlled and his repeat vital signs are much more reassuring.  Consent signed.  Patient accepted by Dr. Mellissa Kohutotoli at [facility] ED.  Will transfer via ALS.      Debera LatHenry Tallin Hart, MD           Debera Latran, Ulysee Fyock, MD  02/12/16 1048

## 2016-02-12 NOTE — ED Triage Notes (Signed)
Pt to ED after accidentally putting a nail into own right wrist with a nail gun. Nail is intact in right wrist, Dr. Laveda Normanran to bedside.       Triage Note   Albesa SeenWendy L Andre Gallego, RN

## 2016-02-12 NOTE — ED Notes (Signed)
Report given to Nicholos JohnsKathleen, RN at [facility].  [facility]

## 2016-02-12 NOTE — Consults (Addendum)
Orthopedic Surgery Consult/ H&P Note       Marcus Montoya   45 y.o. male  MRN: [id number]   DOA: 02/12/2016     Reason for consult: Nail in forearm    HPI: Marcus Montoya is a RHD 45 y.o. male with no significant PMH who presents to the ED with a nail in the R wrist.   Patient states that around 10 AM, they were using a nail gun and now has a 3 inch nail inside of his forearm. Endorse pain in the R forearm. Transferred from [facility] Denies any previous injuries to the extremity. Denies any current numbness or tingling, just severe pain in the wrist, tracking proximally up the forearm. States that there was no bleeding. Tetanus up to date.     NPO since: 63 AM  Other injuries: none    PMH:  History reviewed. No pertinent past medical history.    PSH:  History reviewed. No pertinent surgical history.    Meds:  No current facility-administered medications on file prior to encounter.      No current outpatient prescriptions on file prior to encounter.     Scheduled Meds:   lidocaine PF  10 mg Subcutaneous Once    bupivacaine  10 mL Subcutaneous Once     Continuous Infusions:   morphine sulfate Stopped (02/12/16 1221)     PRN Meds:.morphine sulfate    Allergies:  No Known Allergies (drug, envir, food or latex)    Social History:  Occupation: self employed  Smoking: 1 PPD  Occasional alcohol use  Denies illicit drug use.  Lives with wife, in [place].    Family History:  Noncontributory.     ROS:  Denies CP/dyspnea, recent fevers/chills. Otherwise negative except for that presented in the above HPI.    Physical Exam:     Vitals:     Vitals:    02/12/16 1146   BP: 99/61   Pulse: 68   Resp: 16   Temp: 36.8 C (98.2 F)   Weight: 77.1 kg (170 lb)   Height: 1.702 m (_0 )       General:  Alert, no acute distress, supine in bed.     CV: Regular rate, rhythm  Respiratory: Respirations unlabored on RA  Abd: soft, NT, ND    RUE: The head of a nail is seen in the R wrist, picture below. No bleeding. Able to extend thumb, flex  thumb IP joint, only able to move other fingers minimally secondary to pain. Able to flex/extend wrist minimally due to pain. SILT over deltoid, 1st DWS, volar index and small fingers.  2+ radial pulse, radial pulse is palpated just lateral to the screw head, fingers WWP, < 3 s CR.            Labs:      Recent Labs  Lab 02/12/16  1051   WBC 8.2   Hemoglobin 14.8   Hematocrit 43   Platelets 206       Recent Labs  Lab 02/12/16  1051   Sodium 138   Potassium 4.1   Chloride 104   CO2 23   UN 17   Creatinine 0.99   Glucose 101*       Recent Labs  Lab 02/12/16  1051   INR 1.0   Protime 11.6       No components found with this basename: APTT  No results for input(s): ESR, CRP in the last 168 hours.  Imaging:   Xray: XR of the wrist and forearm show a foreign body in the soft tissue of the volar wrist, no acute fractures    Procedure: After discussing the risks and benefits, the patient gave verbal consent for nail removal from R wrist. 5 cc of lidocaine was used to numb the skin around the nail head after steriley prepping the area. A hemostat was used and the nail was removed in its entirety. Mild bleeding afterwards. The wound was washed with 500 cc of sterile saline. 2+radial pulse after exam, sensation intact, now able to make a fist, flex at index PIP/DIP, and can extend and cross all fingers. Soft dressing applied.     Assessment and Plan:  45 y.o. male with a nail in the R wrist, nail removed at bedside, neruvascularly intact.     1. Procedure as described above  2. WB status: WBAT RUE  3. Pain Control, Elevate/ice injured extremity  4. Keep dressing on for 24-48 hours, allow wound to drain, replace dressing daily as needed  5. Up to date with tetanus  6. Abx per ED  7. Follow-up with Dr. Patsey Berthold in 5-7 days, call [phone] to make an appointment    Plan d/w Dr. Pamalee Leyden, MD  Orthopaedic Surgery   02/12/2016, 12:54 PM

## 2016-02-12 NOTE — ED Provider Notes (Addendum)
History     Chief Complaint   Patient presents with    Arm Injury     HPI Comments: 89 YOM without relevant PMHx [facility] nail in the volar aspect of his right forearm.  This morning, pt was at work when he inadvertently shot a nail into his arm.  Pt had immediate sharp pain and reports decreased motion of his fingers secondary to pain.  Pt endorses normal sensation and denies significant bleeding.      History provided by:  Patient  Language interpreter used: No      History reviewed. No pertinent past medical history.     History reviewed. No pertinent surgical history.  History reviewed. No pertinent family history.    Social History    reports that he has been smoking Cigarettes.  He has been smoking about 1.00 pack per day. He has never used smokeless tobacco. He reports that he drinks alcohol. He reports that he currently engages in sexual activity and has had male partners. He reports that he does not use illicit drugs.    Living Situation     Questions Responses    Patient lives with Significant Other    Homeless No    Caregiver for other family member No    External Services None    Employment Employed    Domestic Violence Risk No          Problem List     Patient Active Problem List   Diagnosis Code    Chest pain R07.9    Tobacco abuse Z72.0       Review of Systems   Review of Systems   Musculoskeletal: Positive for myalgias.   Skin: Positive for wound.   Neurological: Positive for weakness. Negative for numbness.   Hematological: Does not bruise/bleed easily.   Psychiatric/Behavioral: Negative for agitation.       Physical Exam     ED Triage Vitals   BP Heart Rate Heart Rate (via Pulse Ox) Resp Temp Temp src SpO2 O2 Device O2 Flow Rate   02/12/16 1146 02/12/16 1146 -- 02/12/16 1146 02/12/16 1146 -- 02/12/16 1146 -- --   99/61 68  16 36.8 C (98.2 F)  98 %        Weight           02/12/16 1146           77.1 kg (170 lb)                    Physical Exam    Constitutional: He is oriented to person, place, and time. He appears well-developed and well-nourished.   HENT:   Head: Normocephalic and atraumatic.   Neck: Neck supple.   Cardiovascular: Normal rate.    Pulmonary/Chest: Effort normal.   Musculoskeletal:   Head of nail at radial aspect of volar right forearm.    Motor, against resistance:  - Absent abduction of digits  - Absent 1st and 5th digit opposition  - Intact extension of wrist, thumb IP and all MCP joints    Sensory:  - intact to light touch along dorsal web space of 1st and 2nd fingers  - Intact to light touch along volar aspect of distal tip of 2nd finger  - Intact to light touch along volar aspect of distal tip of 5th finger    Vascular:  2+ radial pulse  2+ ulnar pulse  Cap refill <3 sec  Neurological: He is alert and oriented to person, place, and time.   Skin: Skin is warm and dry. He is not diaphoretic.   Psychiatric: He has a normal mood and affect.   Nursing note and vitals reviewed.      Medical Decision Making        Initial Evaluation:  ED First Provider Contact     Date/Time Event User Comments    02/12/16 1158 ED Provider First Contact Hulen Luster Initial Face to Face Provider Contact          Patient seen by me on arrival date of 02/12/2016 at at time of arrival  11:44 AM.  Initial face to face evaluation time noted above may be discrepant due to patient acuity and delay in documentation.    Assessment:  45 y.o.male comes to the ED with nail in volar prox right forearm.  Xrays from SW show nail in soft tissue, no fractures. Pulses intact lowering concern for vascular injury, decreased motor function is concerning for nerve injury though this may be secondary to pain or mechanical obstruction, and intact sensation is reassuring.    Differential Diagnosis includes Vascular injury, Nerve injury, tendon injury, Soft tissue injury                      Plan:   Therapeutic:  - IV Morphine  - Ortho consult      Wyatt Mage,  DO           Wyatt Mage, DO  Resident  02/12/16 1418    Resident Attestation:     Patient seen by me today, 02/12/2016 at 1215    History:   I reviewed this patient, reviewed the resident's note and agree.  Exam:   I examined this patient, reviewed the resident's note and agree.    Decision Making:   I discussed with the resident his/her documented decision making  and agree.        Author Zollie Beckers, MD       Zollie Beckers, MD  02/12/16 [phone]

## 2016-02-12 NOTE — ED Triage Notes (Addendum)
Pt transfer from [facility], pt working today, 3 inch nail to right wrist from nail gun, reported  vagal reaction with BP in 80's, normotensive on arrival.nail remains in right wrist.       Triage Note   Ashok NorrisBrian Milda Lindvall, RN

## 2016-02-12 NOTE — ED Notes (Addendum)
Pt. Endorses triage note, and states last tetanus within 5 years. Pt. Placed on tele to monitor VS, pt. Given emesis bag per his request for nausea.  MD immediately at the bedside.  IV placed, labs drawn, IVF and analgesia given per MD orders.   Plan of Care: Will assess and monitor VS and pain q 2-4hrs. Perform frequent rounding. Provide education to patient/caregiver(s) about status and treatment of pt. Provide support to patient/caregiver(s) as needed. Pt oriented to room and use of call bell.

## 2016-02-12 NOTE — ED Procedure Documentation (Signed)
Procedures   Ultrasound  Date/Time: 02/12/2016 12:56 PM  Performed by: Hassan RowanKVAMME, Severa Jeremiah  Authorized by: Hassan RowanKVAMME, Wyonia Fontanella   Consent: Verbal consent obtained.  Consent given by: patient  Comments: Procedure: Emergency Medicine Limited Non-Invasive Ultrasound    Procedure performed by Hassan RowanErik Glorimar Stroope, MD and Dr. Trisha MangleSvengsook    Date: 02/12/2016   Time: 12:56 PM       Type Other Limited Sonography: Soft tissue    Indications   Evaluation for the following: vascular injury to arm    Findings   Exam limitations include the following: No limitations    Structures visualized include the following: Ulnar artery, radial artery of the R forearm    Impression  The nail dives deep to the ulnar artery. It enter ulnar to the radial artery. No vascular injury/hematoma identified.     Images were interpreted by me, Hassan RowanErik Andilyn Bettcher, MD  Images were archived to [facility]            Hassan RowanErik Charlena Haub, MD     Hassan RowanKvamme, Alani Lacivita, MD  02/12/16 1257

## 2016-02-12 NOTE — ED Notes (Signed)
Bed: PA-01  Expected date: 02/12/16  Expected time: 10:53 AM  Means of arrival: [facility]  Comments:  ADULT CALL-IN    Patient Name: Marcus Montoya, Marcus Montoya    MRN [id number]    AGE: 45    DOB:     PCP/Service Referral: TRANSFER CENTER    Patient Information Note: PT COMING FROM [facility] FOR ED EVALUATION OF 3" NAIL IN R WRIST.  REPORT FROM [facility]- PT WORKING ON ROOF WITH NAIL GUN, HAS NAIL EMBEDDED NEAR DISTAL RADIUS, RADIAL PULSE, MOTION, SENSATION OK.  INITIALLY HYPOTENSIVE TO 82/46, HR 43, IMPROVED   WITH IV FLUIDS.  GETTING 1L IV FLUID BOLUS, HAS 18g L AC.  PAIN DOWN FROM 9 TO 6.    Tests/Orders Requested:    Vital Signs: HR 61     BP 122/81   RR   O2 SAT 95% RA  T 35.4    Relevant Medications: DILAUDID 1 MG IV, ZOFRAN 4 MG IV, TDAP IN LAST 5 YEARS    Requested Evaluation By: ADULT ED    MD Requesting Call Back: NO    IF CALL BACK REQUESTED:    Notify:   At:    Is caller requesting admission   for this patient?: NO    If yes, to which service?    Is referring physician an [facility] provider? NO (TRANSFER)    Call reported to: CALL IN NOTE DONE    Author Dorathy Daftathleen Decari Duggar, RN as of 01/10/2016 at 1053

## 2016-02-15 ENCOUNTER — Encounter: Payer: Self-pay | Admitting: Orthopedic Surgery

## 2016-02-15 ENCOUNTER — Ambulatory Visit: Payer: Self-pay | Admitting: Orthopedic Surgery

## 2016-02-15 VITALS — BP 136/60 | Ht 67.0 in | Wt 170.0 lb

## 2016-02-15 DIAGNOSIS — S51831A Puncture wound without foreign body of right forearm, initial encounter: Secondary | ICD-10-CM

## 2016-02-15 NOTE — Progress Notes (Signed)
Interval history:   Marcus Montoya is a pleasant RHD male presenting today for follow up of right wrist puncture wound. To review, [facility] following a nail from nail gun was imbedded into his right wrist. X-ray demonstrated no bony pathology. He was neurovascularly intact. The nail was removed and the wound was washed out, and he was discharged with antibiotics. He returns today for follow-up.     PHYSICAL EXAM:  Pleasant male who is alert and oriented to person, place, and time. Focused exam of right upper extremity demonstrates well-healing puncture wound of right volar wrist, just radial to FCR and proximal to wrist flexion crease. No erythema or drainage, minimal swelling. Neurovascularly intact with 2+ radial pulse. No motor/sensory deficits.     Imaging: reviewed      Impression/PLAN:   Puncture wound of right volar wrist s/p nail removal. He is healing well with no signs of infection. He has no deficits, and no indication of injury to bone, tendon, nerve, or arteries. We recommended today he continue his course of antibiotics to completion and follow up as needed.     The patient was seen and discussed with Dr. Jayme CloudGonzalez.    Alcide Cleveronald Timoth Schara, MD  02/15/2016

## 2016-12-10 ENCOUNTER — Ambulatory Visit
Admission: AD | Admit: 2016-12-10 | Discharge: 2016-12-10 | Disposition: A | Discharge status: Home / Self Care | Payer: Medicaid Other | Source: Ambulatory Visit | Attending: Urgent Care | Admitting: Urgent Care

## 2016-12-10 DIAGNOSIS — K0889 Other specified disorders of teeth and supporting structures: Secondary | ICD-10-CM | POA: Insufficient documentation

## 2016-12-10 DIAGNOSIS — S025XXA Fracture of tooth (traumatic), initial encounter for closed fracture: Secondary | ICD-10-CM | POA: Insufficient documentation

## 2016-12-10 DIAGNOSIS — K029 Dental caries, unspecified: Secondary | ICD-10-CM | POA: Insufficient documentation

## 2016-12-10 MED ORDER — PENICILLIN V POTASSIUM 500 MG PO TABS *I*
500.0000 mg | ORAL_TABLET | Freq: Four times a day (QID) | ORAL | 0 refills | Status: AC
Start: 2016-12-10 — End: 2016-12-20

## 2016-12-10 NOTE — UC Provider Note (Signed)
History     Chief Complaint   Patient presents with    Dental Pain     HPI Comments: 46 year old male presents with dental pain.  Patient reports he broke off a tooth several years ago.  Reports the tooth became painful over the last 2-3 days.  Denies fever, chills, facial swelling, trouble swallowing, chest pain or shortness of breath.  Patient reports he does not currently have a dentist.      History provided by:  Patient  Language interpreter used: No        Medical/Surgical/Family History     History reviewed. No pertinent past medical history.     Patient Active Problem List   Diagnosis Code    Chest pain R07.9    Tobacco abuse Z72.0            History reviewed. No pertinent surgical history.  History reviewed. No pertinent family history.       Social History   Substance Use Topics    Smoking status: Current Every Day Smoker     Packs/day: 1.00     Types: Cigarettes    Smokeless tobacco: Never Used    Alcohol use Yes      Comment: whiskey on glass every night     Living Situation     Questions Responses    Patient lives with Significant Other    Homeless No    Caregiver for other family member No    External Services None    Employment Employed    Domestic Violence Risk No                Review of Systems   Review of Systems   Constitutional: Negative for chills and fever.   HENT: Positive for dental problem. Negative for facial swelling, sore throat and trouble swallowing.    Eyes: Negative for redness.   Respiratory: Negative for shortness of breath.    Cardiovascular: Negative for chest pain.   Gastrointestinal: Negative for vomiting.   Musculoskeletal: Negative for gait problem.   Skin: Negative for wound.   Neurological: Negative for seizures.   Psychiatric/Behavioral: Negative for hallucinations.       Physical Exam   Triage Vitals  Triage Start: Start, (12/10/16 1220)   First Recorded BP: 122/80, Resp: 18, Temp: 37.1 C (98.8 F) Oxygen Therapy SpO2: 98 %, Heart Rate: 81, (12/10/16 1222)   .  First Pain Reported  0-10 Scale: 6, Pain Location/Orientation: Teeth, (12/10/16 1222)       Physical Exam   Constitutional: He appears well-developed. No distress.   HENT:   Head: Atraumatic.   Mouth/Throat: Oropharynx is clear and moist. Abnormal dentition. Dental caries present. No oropharyngeal exudate.       Eyes: Conjunctivae are normal.   Neck: Neck supple. No tracheal deviation present.   Cardiovascular: Normal rate and regular rhythm.    Pulmonary/Chest: Effort normal. No stridor. No respiratory distress.   Abdominal: He exhibits no distension.   Neurological: He is alert.   Skin: Skin is warm and dry. He is not diaphoretic.   Psychiatric: He has a normal mood and affect. His behavior is normal. Judgment and thought content normal.   Nursing note and vitals reviewed.       Medical Decision Making        Initial Evaluation:  ED First Provider Contact     Date/Time Event User Comments    12/10/16 1226 ED First Provider Contact Scot Jun Initial Face to Face  Provider Contact          Patient was seen on: 12/10/2016        Assessment:  46 y.o.male comes [facility] the [facility] with Dental pain    Differential Diagnosis includes:  Tooth fracture  Dental Abscess  Dental Caries  Aphthous Ulcers  Gingivitis      Plan: Recommend follow up with dentist  Orders Placed This Encounter    penicillin v potassium (VEETIDS) 500 MG tablet     Final Diagnosis    ICD-10-CM ICD-9-CM   1. Pain, dental K08.89 525.9       Current Discharge Medication List      New Medications    Details Last Dose Given Next Dose Due Script Given?   penicillin v potassium (VEETIDS) 500 mg Dose: 500 mg  Take 500 mg by mouth 4 times daily  Quantity 40 tablet, Refill 0  Start date: 12/10/2016, End date: 12/20/2016       Comments: Emergency Encounter                   Providence CrosbyKayleen N Lashun Mccants, [place]    Supervising physician Harless LittenMichael Kamali, MD was immediately available          Providence CrosbySagola, Lawrence Roldan N, [place]  12/10/16 1238

## 2016-12-10 NOTE — ED Notes (Signed)
Discharge instructions were provided to and discussed with the patient. Patient verbalized understanding and denies any further questions/concerns at this time. All of the patient's belongings are accounted for and were taken with the patient at discharge. Karlon Schlafer, RN

## 2016-12-10 NOTE — Discharge Instructions (Signed)
Please follow up with the [facility]  Phone [phone]  Address: [address], [place], [place]  Urgent Care Hours: Monday-Friday, 9:00am-5:00pm  No appointment is necessary, but patients are encouraged to call to schedule a same-day appointment.      OR    [facility]  Phone Number: [phone]  Address: [address], [address], , [place]

## 2016-12-10 NOTE — ED Triage Notes (Signed)
Patient complains of a toothache. He states that he broke that tooth a while ago but it has not hurt until now. He states that he does not have a dentist.        Triage Note   Bonnee QuinKeri Dinia Joynt, RN

## 2017-11-10 ENCOUNTER — Emergency Department: Payer: Medicaid Other | Admitting: Radiology

## 2017-11-10 ENCOUNTER — Emergency Department
Admission: EM | Admit: 2017-11-10 | Discharge: 2017-11-10 | Disposition: A | Discharge status: Home / Self Care | Payer: Medicaid Other | Source: Ambulatory Visit | Attending: Emergency Medicine | Admitting: Emergency Medicine

## 2017-11-10 DIAGNOSIS — M25571 Pain in right ankle and joints of right foot: Secondary | ICD-10-CM | POA: Insufficient documentation

## 2017-11-10 DIAGNOSIS — F17210 Nicotine dependence, cigarettes, uncomplicated: Secondary | ICD-10-CM | POA: Insufficient documentation

## 2017-11-10 DIAGNOSIS — Y9372 Activity, wrestling: Secondary | ICD-10-CM | POA: Insufficient documentation

## 2017-11-10 DIAGNOSIS — X501XXA Overexertion from prolonged static or awkward postures, initial encounter: Secondary | ICD-10-CM

## 2017-11-10 DIAGNOSIS — Y9289 Other specified places as the place of occurrence of the external cause: Secondary | ICD-10-CM | POA: Insufficient documentation

## 2017-11-10 DIAGNOSIS — M7989 Other specified soft tissue disorders: Secondary | ICD-10-CM

## 2017-11-10 DIAGNOSIS — Y998 Other external cause status: Secondary | ICD-10-CM | POA: Insufficient documentation

## 2017-11-10 DIAGNOSIS — X58XXXA Exposure to other specified factors, initial encounter: Secondary | ICD-10-CM | POA: Insufficient documentation

## 2017-11-10 DIAGNOSIS — M25579 Pain in unspecified ankle and joints of unspecified foot: Secondary | ICD-10-CM

## 2017-11-10 MED ORDER — IBUPROFEN 200 MG PO TABS *I*
800.0000 mg | ORAL_TABLET | Freq: Once | ORAL | Status: AC
Start: 2017-11-10 — End: 2017-11-10
  Administered 2017-11-10: 800 mg via ORAL
  Filled 2017-11-10: qty 4

## 2017-11-10 NOTE — ED Provider Notes (Signed)
History     Chief Complaint   Patient presents with    Ankle Pain     47 year old male with no significant past medical history presents to the emergency department for evaluation of right ankle pain.    Patient reports last night at 7 PM he was wrestling with his son and heard a pop had sharp pain over his right lateral ankle.  He is able to bear weight on the ankle and denies weakness in the foot.  He denies pain over his heel or in his calf.  He denies numbness or weakness in the foot.            Medical/Surgical/Family History     History reviewed. No pertinent past medical history.     Patient Active Problem List   Diagnosis Code    Chest pain R07.9    Tobacco abuse Z72.0            History reviewed. No pertinent surgical history.  No family history on file.       Social History   Substance Use Topics    Smoking status: Current Every Day Smoker     Packs/day: 1.00     Types: Cigarettes    Smokeless tobacco: Never Used    Alcohol use Yes      Comment: whiskey on glass every night-states a couple of times a month     Living Situation     Questions Responses    Patient lives with Significant Other    Homeless No    Caregiver for other family member No    External Services None    Employment Employed    Domestic Violence Risk No                Review of Systems   Review of Systems   Musculoskeletal: Positive for arthralgias.   Skin: Negative for rash.   Neurological: Negative for weakness and numbness.       Physical Exam     Triage Vitals  Triage Start: Start, (11/10/17 1337)   First Recorded BP: 119/78, Resp: 16, Temp: 36.5 C (97.7 F), Temp src: TEMPORAL Oxygen Therapy SpO2: 98 %, Oximetry Source: Rt Hand, O2 Device: None (Room air), Heart Rate: 88, (11/10/17 1341)  .  First Pain Reported  0-10 Scale: 8 (when ambulating), Pain Location/Orientation: Ankle Right, (11/10/17 1341)       Physical Exam   Constitutional: He is oriented to person, place, and time. He appears well-developed and well-nourished.    Cardiovascular: Normal rate and regular rhythm.    Pulmonary/Chest: Effort normal and breath sounds normal.   Musculoskeletal:   Right lateral malleolar tenderness    Able to bear weight    No weakness with platnar or dorsiflexion  Negative [place]  No calcaneous tenderness   Neurological: He is alert and oriented to person, place, and time.   Skin: Skin is warm. Capillary refill takes less than 2 seconds.   Nursing note and vitals reviewed.      Medical Decision Making        Initial Evaluation:  ED First Provider Contact     Date/Time Event User Comments    11/10/17 1347 ED First Provider Contact [facility], Akirah Storck Initial Face to Face Provider Contact          Patient seen by me on arrival date of 11/10/2017.    Assessment:  47 y.o.male comes to the ED with right malleolar pain after hearing a pop. No  weakness of achilles tendon. Right ankle w/o fx. Able to bear weight      Differential Diagnosis includes:  Fracture, contusion, sprain, partial tendon/ligament injury     Plan:  Right ankle xray, ibuprofen, provided number for f/up with orthopedic if needed.         Santa Genera, MD          Santa Genera, MD  11/11/17 [phone]

## 2017-11-10 NOTE — Discharge Instructions (Signed)
You should alternate  of ibuprofen no more than every 6 hours with 500 or  of tylenol no more than every 4 hours for pain.     Your xray did not show a fracture.     Apply ice and use a compression bandage to help with pain and swelling. If still having pain in 1-2 weeks follow up with your primary care physician.

## 2017-11-10 NOTE — ED Triage Notes (Signed)
Triage Note   Patient states he has had right ankle pain since 1900 last night, states he was wrestling with his sone and rolled his ankle to the right and heard a pop.  Patient can wiggle his toes, has pain with flexion, is able to ambulate.  Patient states he has not taken anything for pain.  Marylouise Stacks, RN

## 2017-11-11 NOTE — ED Notes (Signed)
Agree with triage note. Assumed care of pt. Pt has c/o rt ankle pain and swelling since last night. Pt rolled rt ankle while wrestling with his son. Pt heard a "pop". Call bell within reach.  Nursing Care Plan:  Will monitor and assess VS and pain scores every 2-4 hours and prn.  Perform frequent rounding prn.  Provide updates to patient and/or cargiver frequently.  Provide support to patient/caregiver as needed.  Teach patient and/or caregivers about patients needs/status working towards discharge.  Patient oriented to room and given call bell.

## 2018-04-03 ENCOUNTER — Emergency Department
Admission: EM | Admit: 2018-04-03 | Discharge: 2018-04-03 | Disposition: A | Discharge status: Home / Self Care | Payer: Self-pay | Source: Ambulatory Visit | Attending: Emergency Medicine | Admitting: Emergency Medicine

## 2018-04-03 DIAGNOSIS — M25561 Pain in right knee: Secondary | ICD-10-CM

## 2018-04-03 DIAGNOSIS — M25469 Effusion, unspecified knee: Secondary | ICD-10-CM

## 2018-04-03 DIAGNOSIS — L539 Erythematous condition, unspecified: Secondary | ICD-10-CM

## 2018-04-03 DIAGNOSIS — M25461 Effusion, right knee: Secondary | ICD-10-CM | POA: Insufficient documentation

## 2018-04-03 LAB — GRAM STAIN: Gram Stain: 0

## 2018-04-03 LAB — BODY FLUID CRYSTAL

## 2018-04-03 MED ORDER — IBUPROFEN 200 MG PO TABS *I*
400.0000 mg | ORAL_TABLET | Freq: Once | ORAL | Status: AC
Start: 2018-04-03 — End: 2018-04-03
  Administered 2018-04-03: 400 mg via ORAL
  Filled 2018-04-03: qty 2

## 2018-04-03 MED ORDER — CEPHALEXIN 500 MG PO CAPS *I*
500.0000 mg | ORAL_CAPSULE | Freq: Four times a day (QID) | ORAL | 0 refills | Status: AC
Start: 2018-04-03 — End: 2018-04-08

## 2018-04-03 NOTE — ED Provider Notes (Addendum)
History     Chief Complaint   Patient presents with    Knee Problem     Marcus Montoya is a 47 y.o. male presents with right knee pain and swelling. Patient reports he has had intermittent swelling of the right knee for over two years. He noted that yesterday there was a small amount of swelling. This morning he woke up with increased swelling, pain, erythema and warmth. Denies fever, chills, numbness, tingling, weakness, coolness of the foot or trauma. No medications prior to arrival.         History provided by:  Patient  Language interpreter used: No        Medical/Surgical/Family History     History reviewed. No pertinent past medical history.     Patient Active Problem List   Diagnosis Code    Chest pain R07.9    Tobacco abuse Z72.0            History reviewed. No pertinent surgical history.  History reviewed. No pertinent family history.       Social History     Tobacco Use    Smoking status: Current Every Day Smoker     Packs/day: 1.00     Types: Cigarettes    Smokeless tobacco: Never Used   Substance Use Topics    Alcohol use: Yes     Comment: 2 drinks per week    Drug use: No     Living Situation     Questions Responses    Patient lives with Significant Other    Homeless No    Caregiver for other family member No    External Services None    Employment Employed    Domestic Violence Risk No                 Review of Systems   Review of Systems   Constitutional: Negative for chills and fever.   HENT: Negative for congestion and sore throat.    Respiratory: Negative for cough and shortness of breath.    Cardiovascular: Negative for chest pain and leg swelling.   Gastrointestinal: Negative for abdominal pain, diarrhea, nausea and vomiting.   Genitourinary: Negative for dysuria.   Musculoskeletal: Positive for joint swelling. Negative for back pain.   Skin: Positive for color change. Negative for rash.   Neurological: Negative for dizziness, weakness, light-headedness, numbness and headaches.    Hematological: Does not bruise/bleed easily.   Psychiatric/Behavioral: Negative for confusion.       Physical Exam     Triage Vitals  Triage Start: Start, (04/03/18 1203)   First Recorded BP: 138/84, Resp: 18, Temp: 37 C (98.6 F), Temp src: TEMPORAL Oxygen Therapy SpO2: 99 %, O2 Device: None (Room air), Heart Rate: 99, (04/03/18 1204)  .  First Pain Reported  0-10 Scale: 2, Pain Location/Orientation: Knee Right, (04/03/18 1204)       Physical Exam  Vitals signs and nursing note reviewed.   Constitutional:       General: He is not in acute distress.     Appearance: Normal appearance. He is well-developed. He is not diaphoretic.   HENT:      Head: Normocephalic and atraumatic.      Nose: Nose normal.      Mouth/Throat:      Mouth: Mucous membranes are moist.      Pharynx: No oropharyngeal exudate.   Eyes:      Extraocular Movements: Extraocular movements intact.      Pupils: Pupils  are equal, round, and reactive to light.   Neck:      Musculoskeletal: Normal range of motion and neck supple.   Cardiovascular:      Rate and Rhythm: Normal rate and regular rhythm.      Pulses: Normal pulses.      Heart sounds: Normal heart sounds. No murmur. No friction rub. No gallop.       Comments: 2+ DP pulse   Pulmonary:      Effort: Pulmonary effort is normal. No respiratory distress.      Breath sounds: Normal breath sounds. No wheezing or rales.   Abdominal:      General: There is no distension.      Palpations: Abdomen is soft.      Tenderness: There is no tenderness.   Musculoskeletal: Normal range of motion.         General: Swelling and tenderness present. No deformity.      Right knee: He exhibits swelling and erythema.        Legs:    Skin:     General: Skin is warm and dry.   Neurological:      General: No focal deficit present.      Mental Status: He is alert and oriented to person, place, and time. Mental status is at baseline.   Psychiatric:         Mood and Affect: Mood normal.         Behavior: Behavior normal.          Thought Content: Thought content normal.         Judgment: Judgment normal.         Medical Decision Making   Patient seen by me on:  04/03/2018    Assessment:  Marcus Montoya is a 47 y.o. male presents with right knee swelling and erythema. On arrival patient HD stable. Exam is notable for right knee edema, erythema and warmth. There appears to be a bursitis without joint effusion. Distal to the knee the patient has a normal exam. Bedside US showed complex fluid collection. After discussion of risks and benefits fluid was removed from the collection. Will place patient on PO abx and refer to orthopedics.       Differential diagnosis:  Bursitis, gout, cellulitis, unlikely septic arthritis     Plan:  PO Motrin  Fluid collection     Ortho Follow up   Discharge with Rx for keflex.               Donia Pounds, MD    Resident Attestation:    Patient seen by me on 04/03/2018.    History:  I reviewed this patient, reviewed the resident's note and agree.    Exam:  I examined this patient, reviewed the resident's note and agree.    Decision Making:  I discussed with the resident his/her documented decision making and agree.      Author:  Cora Daniels, MD       Donia Pounds, MD  Resident  04/03/18 1357       Donia Pounds, MD  Resident  04/03/18 1911       Cora Daniels, MD  04/03/18 2221

## 2018-04-03 NOTE — ED Procedure Documentation (Addendum)
Procedures   Ultrasound - Musculoskeletal/Soft Tissue  Date/Time: 04/03/2018 1:03 PM  Performed by: Donia Pounds, MD  Authorized by: Cora Daniels, MD       Procedure details:     Indications: joint swelling      Assessment for: cellulitis and joint effusion      Structures Visualized:  :  :  :   Lower Extremity: right knee  :  :    Exam limitations: none      Findings:     Fluid collection (measured size): visualized (Loculated and septated fluid collection, no vascular flow )      Soft tissue edema (cobblestoning): not visualized      Foreign body: not visualized      Joint effusion: not visualized      Impression:      Absent: no abscess , no cellulitis , no cyst  and no joint effusion        Concern for fluid collection within the prepatellar bursa            Images were interpreted by me and archived to [facility] PACS.          Donia Pounds, MD     Donia Pounds, MD  Resident  04/03/18 1305       Ultrasound Procedure: I was present and participated during the critical and key components of the procedure, and immediately available during the remainder of the procedure.    Images: Images were reviewed and interpreted by me and I agree with the resident interpretation as documented.    Cora Daniels, MD as of 04/03/2018 at 10:12 PM       Cora Daniels, MD  04/03/18 2212

## 2018-04-03 NOTE — Discharge Instructions (Signed)
You were seen in the ED for knee swelling. You had fluid taken off. We will call with the results. Please take prescribed medication. You were referred to orthopedics. They will call you. Take ibuprofen for pain control. Return if you develop worsening pain, fever, redness, inability to move the knee.

## 2018-04-03 NOTE — ED Notes (Signed)
See triage note.  Patient with swollen right knee for past 2 days.  Denies fevers.  States he has had this problem on and off for 2 years, but it never gets red and usually is not this swollen and hard.  States used to put flooring in.  Nursing Plan of Care:  Will monitor and assess VS and pain scores every 2-4 hours and prn.  Perform frequent rounding prn.  Provide updates to patient and/or cargiver frequently re: labs, procedures, etc.  Provide support to patient/caregiver as needed.  Teach patient and/or caregivers about patients needs/status working towards discharge.  Patient oriented to room and given call bell.

## 2018-04-03 NOTE — ED Procedure Documentation (Addendum)
Procedures   Arthrocentesis  Date/Time: 04/03/2018 1:44 PM  Performed by: Donia Pounds, MD  Authorized by: Cora Daniels, MD     Consent:     Consent obtained:  Verbal    Consent given by:  Patient    Risks discussed:  Bleeding, infection, pain, poor cosmetic result, nerve damage and incomplete drainage    Alternatives discussed:  No treatment  Universal protocol:     Procedure explained and questions answered to patient or proxy's satisfaction: yes      Patient identity confirmed:  Verbally with patient  Location:     Location:  Knee    Knee:  R knee  Anesthesia (see MAR for exact dosages):     Anesthesia method:  Local infiltration    Local anesthetic:  Lidocaine 2% WITH epi    Anesthetic total (ml):  5  Procedure details:     Preparation: Patient was prepped and draped in usual sterile fashion      Needle gauge:  18 G    Ultrasound guidance: no      Approach:  Medial    Aspirate amount:  5    Aspirate characteristics:  Clear    Steroid injected: no      Specimen collected: no    Post-procedure details:     Dressing:  Adhesive bandage    Patient tolerance of procedure:  Tolerated well, no immediate complications        Donia Pounds, MD     Donia Pounds, MD  Resident  04/03/18 1345    I was present and participated during the entire procedure.    Cora Daniels, MD       Cora Daniels, MD  04/03/18 [phone]

## 2018-04-03 NOTE — ED Triage Notes (Signed)
Pt with ongoing knee problems past 2 years. Past 2 days with redness and swelling.       Triage Note   Lenon Oms, RN

## 2018-04-06 LAB — BODY FLUID CELL COUNT
Nucl Cell,FL: 6950 /uL
RBC,FL: 1525 /uL
Segs #,FL: 6324 /uL
Segs %,Fl: 91 %

## 2018-04-06 LAB — PATH REVIEW,FL

## 2018-04-08 LAB — AEROBIC CULTURE: Aerobic Culture: 0

## 2018-04-17 LAB — ANAEROBIC CULTURE: Anaerobic Culture: 0

## 2018-04-21 ENCOUNTER — Encounter: Payer: Self-pay | Admitting: Orthopedic Surgery

## 2018-04-21 ENCOUNTER — Ambulatory Visit
Admission: RE | Admit: 2018-04-21 | Discharge: 2018-04-21 | Disposition: A | Discharge status: Home / Self Care | Payer: Self-pay | Source: Ambulatory Visit | Attending: Orthopedic Surgery | Admitting: Orthopedic Surgery

## 2018-04-21 ENCOUNTER — Other Ambulatory Visit: Payer: Self-pay | Admitting: Orthopedic Surgery

## 2018-04-21 ENCOUNTER — Ambulatory Visit: Payer: Self-pay | Admitting: Orthopedic Surgery

## 2018-04-21 VITALS — BP 123/78 | HR 100 | Ht 67.0 in | Wt 160.0 lb

## 2018-04-21 DIAGNOSIS — M7051 Other bursitis of knee, right knee: Secondary | ICD-10-CM | POA: Insufficient documentation

## 2018-04-21 DIAGNOSIS — M25561 Pain in right knee: Secondary | ICD-10-CM | POA: Insufficient documentation

## 2018-04-21 DIAGNOSIS — M25569 Pain in unspecified knee: Secondary | ICD-10-CM

## 2018-04-21 NOTE — Progress Notes (Signed)
Chief Complaint   Patient presents with    Right Knee - Follow-up       HPI  Marcus Montoya is a 47 y.o. male presenting for evaluation of right knee pain and swelling. 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 He denies issues with these as they would be self limiting. Recently he reports increase in frequency. Acutely he was seen in the ED for swelling with erythema and pain. He had an aspiration of 5mL taken from his knee and sent for culturing and started on antibiotics. He has finished the antibiotics and reports improved pain, resolved erythema. Today he reports no affect to his ROM. No fevers, chills. No new falls.     No past medical history on file.  No current outpatient medications on file.     No current facility-administered medications for this visit.      Social History     Socioeconomic History    Marital status: Married     Spouse name: Not on file    Number of children: Not on file    Years of education: Not on file    Highest education level: Not on file   Occupational History    Not on file   Tobacco Use    Smoking status: Current Every Day Smoker     Packs/day: 1.00     Types: Cigarettes    Smokeless tobacco: Never Used   Substance and Sexual Activity    Alcohol use: Yes     Comment: 2 drinks per week    Drug use: No    Sexual activity: Yes     Partners: Female   Social History Narrative    Not on file           Occupation: Holiday representative  Activity level: moderate  Smoking: yes    ROS  See HPI.   All other systems reviewed and found to be negative.     PE  Vitals reviewed and noted in the chart  WDWN, no acute distress  PERRL, normocephalic atraumatic  Alert and oriented x 3. Pleasant and cooperative with exam   Respirations easy and unlabored  Skin without lesions or rash  Right knee without erythema, edema, ecchymosis. Anterior swelling at infrapatellar region. nontender to palpation. ROM 0-130degrees. Calf  soft and nontender with normal distal sensation to light touch. Strength 5/5.       IMAGING  XRay without effusion. No acute fracture. Swelling to the     A&P:Infrapatellar bursitis of right knee    Marcus Montoya presents for evaluation of right knee swelling consistent with infrapatellar bursitis. He has completed his course of antibiotics and does not have signs or symptoms of infection today. We discussed compression with heat or ice and to protect knee as best as possible. Warning signs of infection discussed and when to return again. Ace wrap provided today to start the compression. Given recent aspiration with antibiotic treatment I would hold off on aspiration again today. He is agreeable with this and will f/u as needed.       Baxter Kail, NP

## 2018-10-03 ENCOUNTER — Emergency Department: Payer: Self-pay

## 2018-10-03 ENCOUNTER — Emergency Department
Admission: EM | Admit: 2018-10-03 | Discharge: 2018-10-03 | Disposition: A | Discharge status: Home / Self Care | Payer: Self-pay | Source: Ambulatory Visit | Attending: Surgery | Admitting: Surgery

## 2018-10-03 ENCOUNTER — Emergency Department
Admission: EM | Admit: 2018-10-03 | Discharge: 2018-10-03 | Disposition: A | Discharge status: Other Acute Inpatient Hospital | Payer: Self-pay | Source: Ambulatory Visit | Attending: Emergency Medicine | Admitting: Emergency Medicine

## 2018-10-03 ENCOUNTER — Encounter: Payer: Self-pay | Admitting: Surgery

## 2018-10-03 DIAGNOSIS — Y9389 Activity, other specified: Secondary | ICD-10-CM | POA: Insufficient documentation

## 2018-10-03 DIAGNOSIS — W278XXA Contact with other nonpowered hand tool, initial encounter: Secondary | ICD-10-CM | POA: Insufficient documentation

## 2018-10-03 DIAGNOSIS — Y998 Other external cause status: Secondary | ICD-10-CM | POA: Insufficient documentation

## 2018-10-03 DIAGNOSIS — Y9289 Other specified places as the place of occurrence of the external cause: Secondary | ICD-10-CM | POA: Insufficient documentation

## 2018-10-03 DIAGNOSIS — W312XXA Contact with powered woodworking and forming machines, initial encounter: Secondary | ICD-10-CM

## 2018-10-03 DIAGNOSIS — S61303A Unspecified open wound of left middle finger with damage to nail, initial encounter: Secondary | ICD-10-CM | POA: Insufficient documentation

## 2018-10-03 DIAGNOSIS — W3189XA Contact with other specified machinery, initial encounter: Secondary | ICD-10-CM | POA: Insufficient documentation

## 2018-10-03 DIAGNOSIS — S61313A Laceration without foreign body of left middle finger with damage to nail, initial encounter: Secondary | ICD-10-CM | POA: Insufficient documentation

## 2018-10-03 DIAGNOSIS — S61203A Unspecified open wound of left middle finger without damage to nail, initial encounter: Secondary | ICD-10-CM

## 2018-10-03 DIAGNOSIS — M79642 Pain in left hand: Secondary | ICD-10-CM

## 2018-10-03 LAB — BASIC METABOLIC PANEL
Anion Gap: 17 — ABNORMAL HIGH (ref 7–16)
CO2: 20 mmol/L (ref 20–28)
Calcium: 10.2 mg/dL (ref 8.6–10.2)
Chloride: 105 mmol/L (ref 96–108)
Creatinine: 0.86 mg/dL (ref 0.67–1.17)
GFR,Black: 119 *
GFR,Caucasian: 103 *
Glucose: 124 mg/dL — ABNORMAL HIGH (ref 60–99)
Lab: 19 mg/dL (ref 6–20)
Potassium: 3.8 mmol/L (ref 3.3–5.1)
Sodium: 142 mmol/L (ref 133–145)

## 2018-10-03 LAB — CBC AND DIFFERENTIAL
Baso # K/uL: 0.1 10*3/uL (ref 0.0–0.1)
Basophil %: 1.2 %
Eos # K/uL: 0.1 10*3/uL (ref 0.0–0.5)
Eosinophil %: 1.8 %
Hematocrit: 45 % (ref 40–51)
Hemoglobin: 15.5 g/dL (ref 13.7–17.5)
Lymph # K/uL: 2.8 10*3/uL (ref 1.3–3.6)
Lymphocyte %: 38.1 %
MCH: 31 pg/cell (ref 26–32)
MCHC: 35 g/dL (ref 32–37)
MCV: 90 fL (ref 79–92)
Mono # K/uL: 0.7 10*3/uL (ref 0.3–0.8)
Monocyte %: 10 %
Neut # K/uL: 3.6 10*3/uL (ref 1.8–5.4)
Platelets: 236 10*3/uL (ref 150–330)
RBC: 5 MIL/uL (ref 4.6–6.1)
RDW: 14.7 % — ABNORMAL HIGH (ref 11.6–14.4)
Seg Neut %: 48.9 %
WBC: 7.3 10*3/uL (ref 4.2–9.1)

## 2018-10-03 LAB — ETHANOL: Ethanol: 100 mg/dL — ABNORMAL HIGH (ref 0–9)

## 2018-10-03 MED ORDER — HYDROMORPHONE HCL PF 1 MG/ML IJ SOLN *WRAPPED*
1.0000 mg | Freq: Once | INTRAMUSCULAR | Status: AC
Start: 2018-10-03 — End: 2018-10-03
  Administered 2018-10-03: 1 mg via INTRAVENOUS
  Filled 2018-10-03: qty 1

## 2018-10-03 MED ORDER — LIDOCAINE HCL 1 % IJ SOLN *I*
INTRAMUSCULAR | Status: AC
Start: 2018-10-03 — End: 2018-10-03
  Filled 2018-10-03: qty 20

## 2018-10-03 MED ORDER — MORPHINE SULFATE 4 MG/ML IV SOLN *WRAPPED*
INTRAVENOUS | Status: DC
Start: 2018-10-03 — End: 2018-10-03
  Filled 2018-10-03: qty 1

## 2018-10-03 MED ORDER — CEFAZOLIN 1000 MG IN STERILE WATER 10ML SYRINGE *I*
PREFILLED_SYRINGE | INTRAVENOUS | Status: AC
Start: 2018-10-03 — End: 2018-10-03
  Administered 2018-10-03: 2000 mg via INTRAVENOUS
  Filled 2018-10-03: qty 20

## 2018-10-03 MED ORDER — MORPHINE SULFATE 4 MG/ML IV SOLN *WRAPPED*
4.0000 mg | Freq: Once | INTRAVENOUS | Status: AC
Start: 2018-10-03 — End: 2018-10-03
  Administered 2018-10-03: 4 mg via INTRAVENOUS

## 2018-10-03 MED ORDER — AMOXICILLIN-POT CLAVULANATE 875-125 MG PO TABS *I*
1.0000 | ORAL_TABLET | Freq: Two times a day (BID) | ORAL | 0 refills | Status: AC
Start: 2018-10-03 — End: 2018-10-10

## 2018-10-03 MED ORDER — ONDANSETRON HCL 2 MG/ML IV SOLN *I*
INTRAMUSCULAR | Status: DC
Start: 2018-10-03 — End: 2018-10-03
  Filled 2018-10-03: qty 2

## 2018-10-03 MED ORDER — ONDANSETRON HCL 2 MG/ML IV SOLN *I*
4.0000 mg | Freq: Once | INTRAMUSCULAR | Status: AC
Start: 2018-10-03 — End: 2018-10-03
  Administered 2018-10-03: 4 mg via INTRAVENOUS

## 2018-10-03 MED ORDER — OXYCODONE HCL 5 MG PO CAPS *A*
5.0000 mg | ORAL_CAPSULE | Freq: Four times a day (QID) | ORAL | 0 refills | Status: DC | PRN
Start: 2018-10-03 — End: 2018-10-22

## 2018-10-03 MED ORDER — CEPHALEXIN 500 MG PO CAPS *I*
500.0000 mg | ORAL_CAPSULE | Freq: Once | ORAL | Status: AC
Start: 2018-10-03 — End: 2018-10-03
  Administered 2018-10-03: 500 mg via ORAL
  Filled 2018-10-03: qty 1

## 2018-10-03 MED ORDER — CEFAZOLIN 1000 MG IN STERILE WATER 10ML SYRINGE *I*
2000.0000 mg | PREFILLED_SYRINGE | Freq: Once | INTRAVENOUS | Status: AC
Start: 2018-10-03 — End: 2018-10-03

## 2018-10-03 MED ORDER — MORPHINE SULFATE 4 MG/ML IV SOLN *WRAPPED*
4.0000 mg | Freq: Once | INTRAVENOUS | Status: AC
Start: 2018-10-03 — End: 2018-10-03

## 2018-10-03 MED ORDER — MORPHINE SULFATE 4 MG/ML IV SOLN *WRAPPED*
INTRAVENOUS | Status: AC
Start: 2018-10-03 — End: 2018-10-03
  Administered 2018-10-03: 4 mg via INTRAVENOUS
  Filled 2018-10-03: qty 1

## 2018-10-03 NOTE — ED Notes (Signed)
Pt cooperative at this time. X-ray completed. Pt agreeing  to receive pain medication at this time.       Plan of care: Provider evaluation. Interventions based on provider exam. Will continue to monitor patient, VS every 2-4 hours and prn. Reevaluate effectiveness of interventions. Will keep pt and family updated on plan of care, and will provide safe pt environment.

## 2018-10-03 NOTE — ED Notes (Signed)
10/03/18 1109   Expected Call-In Information   ED Service [facility] Adult Call-in   PCP/Service Referral Arline Asp, TC   Call received from [facility]? Yes   Pt Info note/Reason for sending Using wood chipper, hand caught with open wound/fx to L 3rd digit. Tetanus UTD. No COVID sx.   Pt Coming from [facility]   Interventions prior to arrival Medication  (4mg  morphine, 2g Ancef)   Pulse 90   BP 134/70   Requested Evaluation By Adult ED   Notify Accepted by Jayme Cloud    Does referring physician have admitting privileges? No   Call reported to ED Charge, call-in note

## 2018-10-03 NOTE — Discharge Instructions (Signed)
Please follow up with Dr. Jayme Cloud of Orthopedics.    If you experience any increasing pain, swelling, drainage, nausea/vomiting, numbness/tingling, fevers/chills, or any other concerning symptoms, please call your primary care provider, present to urgent care, or return to the ED.

## 2018-10-03 NOTE — ED Notes (Signed)
Ambulance called

## 2018-10-03 NOTE — ED Provider Notes (Addendum)
History     Chief Complaint   Patient presents with    Hand Injury     Marcus BignessShawn E Dorthula Montoya is a 48 y.o. male with no significant past medical history who presents with laceration to left middle finger.    Patient was splitting wood with mechanical wood splitter this morning when finger got caught. This caused significant injury to middle finger, no loss of motor function nor injury to other parts of hand. Patient denies other injuries, history of easy bleeding, or other complaints.      History provided by:  Patient  Language interpreter used: No        Medical/Surgical/Family History     No past medical history on file.     Patient Active Problem List   Diagnosis Code    Chest pain R07.9    Tobacco abuse Z72.0            No past surgical history on file.  No family history on file.       Social History     Tobacco Use    Smoking status: Current Every Day Smoker     Packs/day: 1.00     Types: Cigarettes    Smokeless tobacco: Never Used   Substance Use Topics    Alcohol use: Yes     Comment: 2 drinks per week    Drug use: No     Living Situation     Questions Responses    Patient lives with Significant Other    Homeless No    Caregiver for other family member No    External Services None    Employment Employed    Domestic Violence Risk No                Review of Systems   Review of Systems   Constitutional: Negative.    HENT: Negative.    Eyes: Negative.    Respiratory: Negative.    Cardiovascular: Negative.    Gastrointestinal: Negative.    Endocrine: Negative.    Genitourinary: Negative.    Musculoskeletal: Negative.    Allergic/Immunologic: Negative.    Neurological: Negative.    Hematological: Negative.    Psychiatric/Behavioral: Negative.        Physical Exam     Triage Vitals  Triage Start: Start, (10/03/18 1123)   First Recorded BP: 130/72, Resp: 18, Temp: 36.5 C (97.7 F) Oxygen Therapy SpO2: 96 %, O2 Device: None (Room air), Heart Rate: 83, (10/03/18 1124)  .  First Pain Reported  0-10 Scale: 10,  (10/03/18 1124)       Physical Exam  Vitals signs reviewed.   Constitutional:       Appearance: Normal appearance.   HENT:      Head: Normocephalic and atraumatic.   Eyes:      Pupils: Pupils are equal, round, and reactive to light.   Neck:      Musculoskeletal: Normal range of motion and neck supple.   Cardiovascular:      Rate and Rhythm: Normal rate and regular rhythm.   Pulmonary:      Effort: Pulmonary effort is normal.      Breath sounds: Normal breath sounds.   Abdominal:      General: Abdomen is flat.      Palpations: Abdomen is soft.      Tenderness: There is no abdominal tenderness.   Neurological:      Mental Status: He is alert.  Medical Decision Making     Assessment:  Marcus Montoya is a 48 y.o. male who presents with injury to left middle finger.    Differential diagnosis:  Injury to left middle finger    Plan:  Consult orthopedics Hand        Patient had wound managed and dressing applied by Orthopedics. Follow up arranged.       Ozzie Hoyle, MD      Resident Attestation:    Patient seen by me on 10/03/2018.    I saw and evaluated the patient. I agree with the resident's/fellow's findings and plan of care as documented above.    Additional attestation comments:  Seen by hand team, follow up arranged.    Author:  Azzie Roup, MD       Ozzie Hoyle, MD  Resident  10/03/18 1235       Azzie Roup, MD  10/03/18 1311

## 2018-10-03 NOTE — ED Notes (Signed)
Report Given To  Kirt Boys, RN at [facility] ED      Descriptive Sentence / Reason for Admission   Finger injury to third digit on left hand.      Active Issues / Relevant Events   Pain control, infection control      To Do List  Awaiting labs, will continue to monitor vital signs      Anticipatory Guidance / Discharge Planning  Transfer to [facility]

## 2018-10-03 NOTE — ED Triage Notes (Signed)
Pt arrives to ED with spouse with c/o " cutting finger off". Pt was cutting wood with a wood splitter and finger hit blade. Pt with severe laceration to Left middle finger. CMS intact, + sensation in fingers.       Triage Note   Kirk Ruths, RN

## 2018-10-03 NOTE — ED Notes (Signed)
Wife at bedside.

## 2018-10-03 NOTE — ED Notes (Signed)
Pt refusing Morphine at this time. Provider made aware.

## 2018-10-03 NOTE — ED Triage Notes (Signed)
See call in note.        Triage Note   Asencion Guisinger, RN

## 2018-10-03 NOTE — ED Provider Notes (Signed)
History     Chief Complaint   Patient presents with    Finger Laceration     Marcus Montoya is a 48 y.o. male who presents with finger injury.  Pt was using wood splitter and got left finger caught in it.  Patient with pain in left middle finger.  Denies any other injury.  Denies being on any blood thinners.  Last tetanus shot in 2017.  Injury occurred just prior to arrival.  Pain is not bad at baseline, worsened pain with touching the fingertips or attempt at hand movement.          Medical/Surgical/Family History     No past medical history on file.     Patient Active Problem List   Diagnosis Code    Chest pain R07.9    Tobacco abuse Z72.0            No past surgical history on file.  No family history on file.       Social History     Tobacco Use    Smoking status: Current Every Day Smoker     Packs/day: 1.00     Types: Cigarettes    Smokeless tobacco: Never Used   Substance Use Topics    Alcohol use: Yes     Comment: 2 drinks per week    Drug use: No     Living Situation     Questions Responses    Patient lives with Significant Other    Homeless No    Caregiver for other family member No    External Services None    Employment Employed    Domestic Violence Risk No                Review of Systems   Review of Systems   Constitutional: Negative for chills and fever.   Eyes: Negative for visual disturbance.   Respiratory: Negative for shortness of breath.    Cardiovascular: Negative for chest pain and palpitations.   Gastrointestinal: Negative for abdominal pain, diarrhea, nausea and vomiting.   Genitourinary: Negative for dysuria.   Musculoskeletal: Negative for back pain and neck pain.   Skin: Positive for wound. Negative for rash.   Neurological: Negative for headaches.   Hematological: Does not bruise/bleed easily.   Psychiatric/Behavioral: Negative for agitation.       Physical Exam     Triage Vitals  Triage Start: Start, (10/03/18 4166)   First Recorded  ,    .      Physical Exam  Vitals signs and  nursing note reviewed.   Constitutional:       General: He is not in acute distress.     Appearance: He is well-developed. He is not diaphoretic.   HENT:      Head: Normocephalic and atraumatic.   Eyes:      Pupils: Pupils are equal, round, and reactive to light.   Neck:      Musculoskeletal: Normal range of motion.   Cardiovascular:      Rate and Rhythm: Normal rate and regular rhythm.      Heart sounds: Normal heart sounds.   Pulmonary:      Effort: Pulmonary effort is normal.      Breath sounds: Normal breath sounds.   Abdominal:      General: Bowel sounds are normal. There is no distension.      Palpations: Abdomen is soft.      Tenderness: There is no abdominal tenderness.  Musculoskeletal: Normal range of motion.   Skin:     General: Skin is warm and dry.      Comments: Left middle finger with tip avulsed, nail missing.  Exposed distal phalanx bone.  Sensation intact light touch.  Intact DIP, PIP extension and flexion.   Neurological:      Mental Status: He is alert and oriented to person, place, and time.      Cranial Nerves: No cranial nerve deficit.         Medical Decision Making   Patient seen by me on:  10/03/2018    Assessment:  Marcus Montoya is a 48 y.o. male presents with finger avulsion.  VSS.  PE shows Left middle finger with tip avulsed, nail missing.  Exposed distal phalanx bone.  Sensation intact light touch.  Intact DIP, PIP extension and flexion.    Differential diagnosis:  Finger avulsion  Exposed bone  Concern for open fracture    Plan:  Medications  ondansetron (ZOFRAN) 4 MG/2ML injection (has no administration in time range)  morphine 4 MG/ML injection 4 mg (4 mg Intravenous Given 10/03/18 1018)  ceFAZolin Sodium (ANCEF) syringe 2,000 mg (2,000 mg Intravenous Given 10/03/18 1003)  ondansetron (ZOFRAN) injection 4 mg (4 mg Intravenous Given 10/03/18 1021)  * Finger middle LEFT standard PA, Lateral, and Oblique views    (Results Pending)  Labs Reviewed  CBC AND DIFFERENTIAL  BASIC METABOLIC  PANEL  ETHANOL  Transfer to [facility]              Tilden DomeValerie Rawlin Reaume, MD          Tilden DomeLou, Harel Repetto, MD  10/03/18 1022

## 2018-10-03 NOTE — ED Notes (Signed)
Pt arrived via EMS with open fx to L middle finger. Pt says it happened around 1000 while using a wood chipper at home. Went to [facility] and transferred to [facility] for further eval. Denies pain elsewhere. Will monitor.

## 2018-10-03 NOTE — Provider Consult (Signed)
[facility] [place]  ORTHOPAEDIC HAND SURGERY CONSULT NOTE FOR 10/03/2018         Marcus Montoya   48 y.o. male  MRN: [id number]   DOA: 10/03/2018     Reason for consult: Left hand pain/injury    HPI: Marcus Montoya is a RHD 48 y.o. male with PMH of anxiety who presents to the [facility] ED with L middle finger pain sustained after wood splitter injury.  Patient states that around 10:00 am, he was splitting wood when he got the middle finger caught between the wood and the splitter. [facility] presented [facility], and was transferred to [facility]. He received ancef at [facility]. He is UTD on tetanus. Currently endorses pain in the left hand. Reports previous injury to L small finger. Denies any current numbness or tingling, hitting head, LOC, chest pain, shortness of breath or pain elsewhere. This was not a work related injury.     PMH:  No past medical history on file.    PSH:  No past surgical history on file.    Meds:  Current Facility-Administered Medications   Medication Dose Route Frequency Provider Last Rate Last Dose    HYDROmorphone PF (DILAUDID) injection 1 mg  1 mg Intravenous Once Kathrin Greathouse, MD        cephalexin [facility]) capsule 500 mg  500 mg Oral Once Kathrin Greathouse, MD         Current Outpatient Medications   Medication Sig Dispense Refill    sertraline (ZOLOFT) 25 MG tablet Take 25 mg by mouth daily         Allergies:  No Known Allergies (drug, envir, food or latex)    Social History:  Occupation: Architect.  Smoking: 1 ppd  Every other day alcohol use  Denies illicit drug use.  Lives in [place] with wife.    Family History:  Noncontributory. Negative for bleeding/clotting disorder.    ROS:  Denies CP/dyspnea, recent fevers/chills. Otherwise negative except for that presented in the above HPI.    Physical Exam:    Vitals:  Temp:  [35.9 C (96.6 F)-36.5 C (97.7 F)] 36.5 C (97.7 F)  Heart Rate:  [58-85] 83  Resp:  [18-24] 18  BP: (98-132)/(72-99) 130/72    General:  Alert,  no acute distress, supine in bed.     CV: Regular rate, rhythm  Respiratory: Respirations unlabored on RA  Abd: soft, NT, ND    LUE: Soft tissue avulsion to distal middle finger to the level of the DIP dorsally and to mid-middle phalanx volarly with visible bone distal to the very minimally remaining eponychial fold, with no extensor lag, able to fully flex all PIP/DIP joints.  Able to make okay sign, extend thumb and adduct/abduct fingers.  Sensation intact to light touch distal to wound (radial and ulnar aspects of digit), 5 mm two-point discrimination preserved in remaining soft tissue.  SILT over dorsal first web space, volar 2nd and 5th digits.  2+ radial pulse, fingers WWP.  The remaining skin is not cyanotic or dusky, <3 seconds capillary refill.                  Labs:    Recent Labs   Lab 10/03/18  1017   WBC 7.3   Hemoglobin 15.5   Hematocrit 45   Platelets 236     Recent Labs   Lab 10/03/18  1017   Sodium 142   Potassium 3.8   Chloride  105   CO2 20   UN 19   Creatinine 0.86   Glucose 124*     No results for input(s): INR, PTI in the last 168 hours.    No components found with this basename: APTT  No results for input(s): ESR, CRP in the last 168 hours.    Imaging:   Xray: Left middle finger XR shows no acute fracture or dislocation    Procedure:     Laceration Repair:  After explanation of the risks/benefits/alternatives of the proposed procedure, the patient voiced understanding and verbal consent was obtained.  The correct patient, procedure, and site was verified. The injection site(s) were cleansed with chlorhexidine. Digital/local block was achieved by injecting 10 ml of 1% lidocaine without epinephrine. The injured area was explored and then irrigated with copious sterile saline. The wounds were cleansed and dressed with polysporin, xeroform, and a soft dressing. A metal finger splint was then applied. Patient tolerated the procedure well with no immediate complications.     Assessment and Plan:  48  y.o. male with a left middle finger distal soft tissue avulsion after wood-chipper injury. NVI    1. Procedure as above  2. NWB, ice/elevate  3. Pain control  4. Antibiotics: IV Ancef given in ED, home on PO augmentin; if penicillin allergic, please give clindamycin  5. F/u with Dr. Patsey Berthold on Tuesday 4/21 or Thursday 4/23 (call [phone] for appointment)    Plan d/w Dr. Ardine Eng    Date/Time Paged: 10/03/2018 at 11:50  Date/Time Evaluated: 10/03/2018 at 12:00  Date/Time Recommendations Made: 10/03/2018 at 12:30    Milana Huntsman, MD  Orthopaedic Surgery  10/03/2018, 11:51 AM

## 2018-10-03 NOTE — ED Notes (Signed)
Pt resting comfortably at this time. Wife at bedside.

## 2018-10-06 ENCOUNTER — Encounter: Payer: Self-pay | Admitting: Orthopedic Surgery

## 2018-10-06 ENCOUNTER — Ambulatory Visit: Payer: Self-pay | Attending: Otolaryngology | Admitting: Orthopedic Surgery

## 2018-10-06 VITALS — BP 133/81 | Ht 67.0 in | Wt 160.0 lb

## 2018-10-06 DIAGNOSIS — T148XXA Other injury of unspecified body region, initial encounter: Secondary | ICD-10-CM

## 2018-10-06 MED ORDER — GABAPENTIN 300 MG PO CAPSULE *I*
300.0000 mg | ORAL_CAPSULE | Freq: Three times a day (TID) | ORAL | 0 refills | Status: DC
Start: 2018-10-06 — End: 2018-10-22

## 2018-10-06 NOTE — Progress Notes (Signed)
[name] has performed the duties of a scribe for this encounter in the presence of the documenting physician. This patient was seen by Dr. Jayme Cloud who  personally reviewed the patient's history and added the pertinent information. He has preformed the physical exam and the above is reflective of his findings, diagnosis and plan.    Left hand:   Soft tissue avulsion to the left of the DIP long finger,possibly very minimal exposed bone at the level of the eponychial fold radially. Able to flex and extend all 3 joints actively. Extends thumb, abducts and adducts fingers. Sensation intact distal to wound, radial and ulnar aspects. SILT dorsal first web space, volar 2nd and 5th digits. 2+ radial pulse, fingers WWP, < 3 seconds capillary refill.       Imaging:  Avulsion soft tissue surrounding left middle finger distal phalanx. No fracture or dislocation. No radiopaque foreign body.     Assessment and Plan:  Marcus Montoya is a 48 y.o. male with soft tissue avulsion left middle finger after wood-chipper injury. He will begin wet to dry dressing changes twice a day. OTC analgesics as needed. Gabapentin prescribed today with instructions for use. No concerns for infection today, signs and symptoms reviewed. Follow up 1 week for wound check. Finish Augmentin.         Hand and Upper Extremity Surgery    Chief Complaint:  Marcus Montoya is a 48 y.o. male presenting with left middle finger injury     Hand Dominance:   right    History of Present Illness:  Patient here today as an ED follow up. He was at home on 10/03/2018 and caught his finger between wood and the splitter. He presented to the ED; was up-to-date on tetanus.Laceration repaired.  Currently taking Augmentin. Has been wrapped in soft dressing. Moderate pain, well controlled with oxycodone. No numbness. No consitutional signs of illness. Prior injury to left small finger.     Review of Systems:  Pertinent findings per HPI.  All other systems negative with  a full 10 point review of systems.    Social History:  Patient  reports that he has been smoking cigarettes. He has been smoking about 1.00 pack per day. He has never used smokeless tobacco. He reports current alcohol use. He reports that he does not use drugs.    Pertinent Medical and Surgical History, Family History, Social History, Medications, and Allergies noted above and/or reviewed in the electronic medical record.    Physical Exam:  Vital Signs: BP 133/81 Comment: transcribed from the ED   Ht 1.702 m (5\' 7" )    Wt 72.6 kg (160 lb)    BMI 25.06 kg/m   General Appearance: No acute distress. Alert and oriented to person, place, and time.  Mood and Affect: Normal    Left hand:   Soft tissue avulsion to the left of the DIP long finger,possibly very minimal exposed bone at the level of the eponychial fold radially. Able to flex and extend all 3 joints actively. Extends thumb, abducts and adducts fingers. Sensation intact distal to wound, radial and ulnar aspects. SILT dorsal first web space, volar 2nd and 5th digits. 2+ radial pulse, fingers WWP, < 3 seconds capillary refill.       Imaging:  Avulsion soft tissue surrounding left middle finger distal phalanx. No fracture or dislocation. No radiopaque foreign body.     Assessment and Plan:  Marcus Montoya is a 48 y.o. male with soft tissue  avulsion left middle finger after wood-chipper injury. He will begin wet to dry dressing changes twice a day. OTC analgesics as needed. Gabapentin prescribed today with instructions for use. No concerns for infection today, signs and symptoms reviewed. Follow up 1 week for wound check. Finish Augmentin.

## 2018-10-13 ENCOUNTER — Ambulatory Visit: Payer: Self-pay | Admitting: Orthopedic Surgery

## 2018-10-13 VITALS — BP 126/92 | Ht 67.0 in | Wt 160.0 lb

## 2018-10-13 DIAGNOSIS — T148XXA Other injury of unspecified body region, initial encounter: Secondary | ICD-10-CM

## 2018-10-13 MED ORDER — OXYCODONE-ACETAMINOPHEN 5-325 MG PO TABS *I*
ORAL_TABLET | ORAL | 0 refills | Status: DC
Start: 2018-10-13 — End: 2018-10-22

## 2018-10-13 NOTE — Progress Notes (Signed)
Hand and Upper Extremity Surgery    Chief Complaint:  Marcus Montoya is a 48 y.o. male presenting with left middle finger injury, returns today with pain.     Hand Dominance:   right    History of Present Illness:  Patient here today as an ED follow up. He was at home on 10/03/2018 and caught his finger between wood and the splitter. He presented to the ED; was up-to-date on tetanus.Laceration repaired. No longer taking Augmentin. Has been doing wet to dry dressings. Moderate pain,  He has no more oxycodone. Patient states he has trouble sleeping,  No numbness. No consitutional signs of illness. Prior injury to left small finger.     Review of Systems:  Pertinent findings per HPI.  All other systems negative with a full 10 point review of systems.     Social History:  Patient  reports that he has been smoking cigarettes. He has been smoking about 1.00 pack per day. He has never used smokeless tobacco. He reports current alcohol use. He reports that he does not use drugs.    Pertinent Medical and Surgical History, Family History, Social History, Medications, and Allergies noted above and/or reviewed in the electronic medical record.    Physical Exam:  Vital Signs: General Appearance: No acute distress. Alert and oriented to person, place, and time.  Mood and Affect: Normal    Left hand:   Soft tissue avulsion to the left of the DIP long finger,possibly very minimal exposed bone at the level of the eponychial fold radially. Able to flex and extend all 3 joints actively. Extends thumb, abducts and adducts fingers. Sensation intact distal to wound, radial and ulnar aspects. SILT dorsal first web space, volar 2nd and 5th digits. 2+ radial pulse, fingers WWP, < 3 seconds capillary refill. Very painful over distal phalanx. Good skin coverage which is viable. No signs of infection.      Imaging:  Avulsion soft tissue surrounding left middle finger distal phalanx. No fracture or dislocation. No radiopaque foreign body.      Assessment and Plan:  Marcus Montoya is a 48 y.o. male with soft tissue avulsion left middle finger after wood-chipper injury. He will continue wet to dry dressing changes once a day. OTC analgesics as needed. Will give percocet for pain as patient does not want to take gabapentin over concerns of drug interactions. No concerns for infection today, signs and symptoms reviewed. Follow up 1 week for wound check. Finish Augmentin.

## 2018-10-22 ENCOUNTER — Other Ambulatory Visit: Payer: Self-pay | Admitting: Otolaryngology

## 2018-10-22 ENCOUNTER — Ambulatory Visit: Payer: Self-pay | Admitting: Orthopedic Surgery

## 2018-10-22 VITALS — BP 122/94 | Ht 67.0 in | Wt 160.0 lb

## 2018-10-22 DIAGNOSIS — T148XXA Other injury of unspecified body region, initial encounter: Secondary | ICD-10-CM

## 2018-10-22 MED ORDER — OXYCODONE-ACETAMINOPHEN 5-325 MG PO TABS *I*
ORAL_TABLET | ORAL | 0 refills | Status: DC
Start: 2018-10-22 — End: 2018-10-22

## 2018-10-22 MED ORDER — OXYCODONE-ACETAMINOPHEN 5-325 MG PO TABS *I*
ORAL_TABLET | ORAL | 0 refills | Status: DC
Start: 2018-10-22 — End: 2018-10-28

## 2018-10-22 NOTE — Progress Notes (Signed)
Spoke with patient's wife, he is using Percocet 5-325 mg TID for pain control, we adjusted Rx so he can fill it today as he is out.

## 2018-10-22 NOTE — Progress Notes (Signed)
Hand and Upper Extremity Surgery    Chief Complaint:  Marcus Montoya is a 48 y.o. male presenting with left middle finger injury, returns today with pain.     Hand Dominance:   right    History of Present Illness:  Patient here today as an ED follow up. He was at home on 10/03/2018 and caught his finger between wood and the splitter. He presented to the ED; was up-to-date on tetanus.Laceration repaired. No longer taking Augmentin. Has been doing wet to dry dressings. Moderate pain,  He has no more oxycodone. Patient states he has trouble sleeping,  No numbness. No consitutional signs of illness. Prior injury to left small finger.     Review of Systems:  Pertinent findings per HPI.  All other systems negative with a full 10 point review of systems.     Social History:  Patient  reports that he has been smoking cigarettes. He has been smoking about 1.00 pack per day. He has never used smokeless tobacco. He reports current alcohol use. He reports that he does not use drugs.    Pertinent Medical and Surgical History, Family History, Social History, Medications, and Allergies noted above and/or reviewed in the electronic medical record.    Physical Exam:  Vital Signs: General Appearance: No acute distress. Alert and oriented to person, place, and time.  Mood and Affect: Normal    Left hand:   Soft tissue avulsion to the left of the DIP long finger,possibly very minimal exposed bone at the level of the eponychial fold radially. Able to flex and extend all 3 joints actively. Extends thumb, abducts and adducts fingers. Sensation intact distal to wound, radial and ulnar aspects. SILT dorsal first web space, volar 2nd and 5th digits. 2+ radial pulse, fingers WWP, < 3 seconds capillary refill. Very painful over distal phalanx. Good skin coverage which is viable. No signs of infection.      Imaging:  Avulsion soft tissue surrounding left middle finger distal phalanx. No fracture or dislocation. No radiopaque foreign body.      Assessment and Plan:  Marcus Montoya is a 48 y.o. male with soft tissue avulsion left middle finger after wood-chipper injury. He will continue wet to dry dressing changes once a day. OTC analgesics as needed. Will give percocet for pain as patient does not want to take gabapentin over concerns of drug interactions. No concerns for infection today, signs and symptoms reviewed. Patient is still not tolerating wet to dry even with percocet. Pateint would like to proceed with surgery. Will have him set up next week for revision amputation.

## 2018-10-26 ENCOUNTER — Encounter: Payer: Self-pay | Admitting: Orthopedic Surgery

## 2018-10-26 NOTE — Progress Notes (Signed)
Medical Necessity    Marcus Montoya is a 48 y.o. male presenting with left middle finger injury.    Soft tissue avulsion to the left of the DIP long finger,possibly very minimal exposed bone at the level of the eponychial fold radially. Able to flex and extend all 3 joints actively. Extends thumb, abducts and adducts fingers. Sensation intact distal to wound, radial and ulnar aspects. SILT dorsal first web space, volar 2nd and 5th digits. 2+ radial pulse, fingers WWP, < 3 seconds capillary refill. Very painful over distal phalanx. Good skin coverage which is viable. No signs of infection.      Marcus Montoya is a 48 y.o. male with soft tissue avulsion left middle finger after wood-chipper injury. He will continue wet to dry dressing changes once a day. OTC analgesics as needed. Will give percocet for pain as patient does not want to take gabapentin over concerns of drug interactions. Patient is still not tolerating wet to dry even with percocet. Patient has some exposed bone and is not healing. Patient will need amputation as soon as OR is available.

## 2018-10-27 ENCOUNTER — Encounter: Payer: Self-pay | Admitting: Surgery

## 2018-10-27 ENCOUNTER — Ambulatory Visit
Admission: RE | Admit: 2018-10-27 | Discharge: 2018-10-27 | Disposition: A | Discharge status: Home / Self Care | Payer: 344 | Source: Ambulatory Visit | Attending: Nurse Practitioner | Admitting: Nurse Practitioner

## 2018-10-27 DIAGNOSIS — Z1159 Encounter for screening for other viral diseases: Secondary | ICD-10-CM | POA: Insufficient documentation

## 2018-10-27 LAB — COVID-19 PCR: COVID-19 PCR: 0

## 2018-10-28 ENCOUNTER — Encounter
Admission: RE | Disposition: A | Discharge status: Home / Self Care | Payer: Self-pay | Source: Ambulatory Visit | Attending: Orthopedic Surgery

## 2018-10-28 ENCOUNTER — Encounter: Payer: Self-pay | Admitting: Orthopedic Surgery

## 2018-10-28 ENCOUNTER — Encounter: Payer: Self-pay | Admitting: Nurse Practitioner

## 2018-10-28 ENCOUNTER — Ambulatory Visit
Admission: RE | Admit: 2018-10-28 | Discharge: 2018-10-28 | Disposition: A | Discharge status: Home / Self Care | Payer: Self-pay | Source: Ambulatory Visit | Attending: Orthopedic Surgery | Admitting: Orthopedic Surgery

## 2018-10-28 DIAGNOSIS — W3189XA Contact with other specified machinery, initial encounter: Secondary | ICD-10-CM | POA: Insufficient documentation

## 2018-10-28 DIAGNOSIS — Y9289 Other specified places as the place of occurrence of the external cause: Secondary | ICD-10-CM | POA: Insufficient documentation

## 2018-10-28 DIAGNOSIS — S61203A Unspecified open wound of left middle finger without damage to nail, initial encounter: Secondary | ICD-10-CM

## 2018-10-28 DIAGNOSIS — T148XXA Other injury of unspecified body region, initial encounter: Secondary | ICD-10-CM

## 2018-10-28 DIAGNOSIS — Y998 Other external cause status: Secondary | ICD-10-CM | POA: Insufficient documentation

## 2018-10-28 DIAGNOSIS — S6982XA Other specified injuries of left wrist, hand and finger(s), initial encounter: Secondary | ICD-10-CM | POA: Insufficient documentation

## 2018-10-28 HISTORY — PX: PR AMP F/TH 1/2 JT/PHALANX W/NEURECT W/DIR CLSR: 26951

## 2018-10-28 HISTORY — PX: PR AMPUTATION FINGER/THUMB: 26951

## 2018-10-28 SURGERY — AMPUTATION, FINGER
Anesthesia: Local | Site: Hand | Laterality: Left | Wound class: Dirty or Infected

## 2018-10-28 MED ORDER — LIDOCAINE-EPINEPHRINE 1 %-1:100000 IJ SOLN *I*
INTRAMUSCULAR | Status: DC | PRN
Start: 2018-10-28 — End: 2018-10-28
  Administered 2018-10-28: 2 mL via SUBCUTANEOUS
  Administered 2018-10-28: 6 mL via SUBCUTANEOUS

## 2018-10-28 MED ORDER — OXYCODONE-ACETAMINOPHEN 5-325 MG PO TABS *I*
1.0000 | ORAL_TABLET | Freq: Four times a day (QID) | ORAL | 0 refills | Status: DC | PRN
Start: 2018-10-28 — End: 2019-10-07

## 2018-10-28 MED ORDER — BUPIVACAINE HCL 0.25 % IJ SOLUTION *WRAPPED*
Status: DC | PRN
Start: 2018-10-28 — End: 2018-10-28
  Administered 2018-10-28: 8 mL via SUBCUTANEOUS

## 2018-10-28 SURGICAL SUPPLY — 21 items
BANDAGE ELAST SLF-CLSR 2 X5.5 LF NONSTER (Dressing) ×2 IMPLANT
BANDAGE SLF ADHER 1IN NONSTER LF (Dressing) ×2 IMPLANT
BANDAGE SLF ADHER 4IN STER LF (Dressing) ×2 IMPLANT
CORD BIPOLAR 12 FT (Supply) ×1
CORD ELECTROSURGICAL L12FT BIPOLAR DISPOSABLE FOR FOOT SWITCHING FORCEPS (Supply) ×1 IMPLANT
COVER LIGHT HANDLE STERIS ST (Other) ×4 IMPLANT
DRESSING FLUFF SUPER SP 6 X 6.75IN STER (Dressing) ×4 IMPLANT
DRESSING XEROFORM STR 1 IN X8 (Dressing) ×2 IMPLANT
GLOVE BIOGEL PI MICRO IND UNDER SZ 7.5 LF (Glove) ×10 IMPLANT
GOWN SIRIUS RAGLAN NONREINFORCED XL (Gown) ×2 IMPLANT
PACK CUSTOM HAND AOC LF (Pack) ×1 IMPLANT
PACK CUSTOM HAND CDS (Pack) ×1
PAD GROUNDING STERILE (Other) ×2 IMPLANT
PENCIL ELECTROSURGICAL (Other) ×2 IMPLANT
SLEEVE COMP KNEE HI MED (Supply) IMPLANT
SOL SOD CHL IRRIG 250ML BTL (Solution) ×2 IMPLANT
SPIKE MINI DISPENSING PIN (BBRAUN DP1000) (Supply) ×2 IMPLANT
SUTR CHROMIC GUT 4-0 PS-2 18 IN (Suture) ×2 IMPLANT
SUTR ETHILON 4-0 PS-2 BLACK (Suture) IMPLANT
SUTR MONOCRYL PLUS 4-0 18PS2 (Suture) IMPLANT
SYRINGE LUERLOCK 30ML INDIVIDUAL WRAP (Syringe) ×2 IMPLANT

## 2018-10-28 NOTE — Discharge Instructions (Signed)
DISCHARGE INSTRUCTIONS  Hand Surgery  Dr. Ronald Gonzalez  Phone:  [phone]    Activity: The hand that was operated on should be elevated above your heart for the first 14 days following surgery.  This is very important to limit swelling.  Keep limb elevated on 2-3 pillows above your heart for the first 48 hours after surgery.  Rest today; tomorrow you can resume your usual activities. It also very important not to use your hand, except as specifically instructed.     Your cooperation with the rehabilitation program is essential for you to get the best results from your surgery.    Exercises:  Move your fingers and thumb that are free of your bandage often (5 minutes, 4 times a day) so they do not become stiff.  It is also important to move your shoulder and elbow in all directions frequently every day to avoid becoming stiff.  Once the bandage is removed, you will begin hand therapy.  At first, the goal is to regain normal motion, then you can begin strengthening exercises.  Follow the instructions given to you by the hand therapist.    Diet:  Resume your usual diet.    Wound/Dressing Care:  The cast/splint needs to remain on until your next appointment.  It is important clean and dry.  Once the bandage and sutures are removed, you will be given further instructions about wound care.  While the cast/splint is on, you may cover it with a towel and secure it with a plastic bag to take a shower.  In the shower, remember not use this hand and keep it elevated and out of the way of the direct flow of the water.  Ice to the area of your surgery may make you more comfortable.  You may put ice over a towel on your cast or bandage for 30 minutes up to 5 times a day.    Medications:  Resume your routine medications.  A prescription for pain medication will be provided.  This is a narcotic and these medications have side effects.  As the pain lessens, you may switch to a non-prescription strength medication (such as Tylenol  or ibuprofen).  Many pain medications cause constipation.  You can help prevent this by drinking plenty of liquids, eating a well-balanced diet and using a stool softener if needed.    Follow-Up:  You should return for your next appointment within 10-14 days after surgery.  If you have not been given an appointment yet, please call [phone] to arrange this. Return to school or work will be determined at your postoperative visit.    Special Instructions:  Driving is up to your discretion, but it is not advisable while taking pain medication.  Specific circumstances should be discussed with Dr. Gonzalez.      Call the office if you experience any of the following:  1. Fever of 101? or higher  2. Pain that does not lessen with the pain medication prescribed to you by Dr. Gonzalez  3. Persistent nausea or vomiting into the next day   4. Persistent numbness or tingling in your fingers that lasts longer than 24 hours  5. Swelling of limb or severe tightness of bandage not relieved with elevation of limb above the level of your heart.  6. Pale/blue or cold fingers/nail beds (compared to opposite side)  7. Bleeding or continuous oozing that saturates the bandage that does not stop after applying pressure for 10 minutes.  8. If you   have not urinated within 6 hours after discharge.

## 2018-10-28 NOTE — Op Note (Addendum)
Operative Note (Surgical Case/Log ID: [id number])       Date of Surgery: 10/28/2018       Surgeons: Surgeon(s) and Role:     Edgardo Roys, DO - Primary     * Mylo Red, MD       Pre-op Diagnosis: Pre-Op Diagnosis Codes:     * Soft tissue avulsion [T14.8XXA]       Post-op Diagnosis: Post-Op Diagnosis Codes:     * Soft tissue avulsion [T14.8XXA]       Procedure(s) Performed: Procedure(s) (LRB):  AMPUTATION, LEFT MIDDLE FINGER (Left)'  Revision amputation left middle finger        Anesthesia Type: Anesthesia type not filed in the log.        Fluid Totals: No intake/output data recorded.       Estimated Blood Loss: 2cc         INDICATIONS FOR PROCEDURE:  Marcus Montoya is a 48 y.o. male who caught his finger between wood and a splitter on 10/03/18. He went to the ED and then followed up. On follow up he had increased granulation tissue, but exposed bone and we discussed a revision amputation for improved coverage.  The risks, benefits, and alternatives to performing this procedure were discussed, including bleeding, infection, damage to surrounding structures, failure of the fracture to heal, failure of implants, need for further surgery, as well as the risks of anesthesia, including stroke, heart attack, and death.  After expressing verbal understanding, the patient elected to proceed with operative management. All questions were answered to their satisfaction.  Informed consent was signed and completed by both the patient and physician.     DESCRIPTION OF PROCEDURE:  The patient was met in the preanesthesia holding area by the attending surgeon where the left upper/middle finger extremity was marked and signed as the correct operative side and site of surgery by Dr. Patsey Berthold.  The surgical consent was once again reviewed and confirmed.  The left upper extremity was prepped and draped in the usual sterile fashion.  A timeout was held once again confirming the procedure to be performed, that we were operating on  the correct operative site, that preoperative antibiotics were administered, that all appropriate implants were available for the procedure, that all necessary imaging was available for the procedure, and that all safety precautions were being utilized.    The patient underwent local anesthesia with 1% lidocaine with 0.25 marcaine. The blocked was allowed to set up. A finger tourniquet was applied. The exposed bone was rongeured back to allow for soft tissue closure. Sharp debridement was utilized to free up soft tissue. Once the bone was adequately removed a rasp was used to smooth the edges. Electrocautery was utilized at the nail plate. The wound was copiously irrigated and closed with 3-0 chromic in horizontal mattress fashion. The finger tourniquate was removed. The patient was Tolerated well. All counts were correct at the conclusion of the case. The patient was able to walk to PACU in good condition.     Dr. Patsey Berthold was present and immediately available for the entirety of the procedure.  I saw and evaluated the patient and agree with resident findings, assessment and plan.      Edgardo Roys, DO        Signed:  Mylo Red, MD  on 10/28/2018 at 2:42 PM

## 2018-10-28 NOTE — INTERIM OP NOTE (Signed)
Interim Op Note (Surgical Log ID: [id number])       Date of Surgery: 10/28/2018       Surgeons: Surgeon(s) and Role:     Chauncey Mann, DO - Primary     * Allyne Gee, MD       Pre-op Diagnosis: Pre-Op Diagnosis Codes:     * Soft tissue avulsion [T14.8XXA]       Post-op Diagnosis: Post-Op Diagnosis Codes:     * Soft tissue avulsion [T14.8XXA]       Procedure(s) Performed: Procedure(s) (LRB):  AMPUTATION, LEFT MIDDLE FINGER (Left)       Anesthesia Type: Anesthesia type not filed in the log.        Fluid Totals: No intake/output data recorded.       Estimated Blood Loss: * No values recorded between 10/28/2018 12:00 AM and 10/28/2018  2:33 PM *       Specimens to Pathology:  * No specimens in log *       Temporary Implants:        Packing:                 Patient Condition: good       Findings (Including unexpected complications): Per op note     Signed:  Allyne Gee, MD  on 10/28/2018 at 2:33 PM

## 2018-10-29 ENCOUNTER — Telehealth: Payer: Self-pay | Admitting: Otolaryngology

## 2018-10-29 NOTE — Telephone Encounter (Signed)
Spoke with patient's wife, he is s/p left middle finger amputation revision yesterday. Rates pain 12/10, recently took 2 Percocet 5-325 and last took ibuprofen 1 pm 800 mg. His fingers are WWP, pink.   We discussed unwrapping the outer bandage to alleviate some of the pressure, we discussed ibuprofen and additional acetaminophen dosing, discussed MDD. He can take 1-2 Percocet every 4-6 hours and she will let me know how he's doing tomorrow, will call with any questions or concerns in the interim. Keep elevated and can ice through bandage.

## 2018-10-30 ENCOUNTER — Encounter: Payer: Self-pay | Admitting: Orthopedic Surgery

## 2018-10-30 ENCOUNTER — Other Ambulatory Visit: Payer: Self-pay | Admitting: Otolaryngology

## 2018-10-30 ENCOUNTER — Encounter: Payer: Self-pay | Admitting: Otolaryngology

## 2018-10-30 MED ORDER — OXYCODONE HCL 5 MG PO TABS *I*
5.0000 mg | ORAL_TABLET | ORAL | 0 refills | Status: DC | PRN
Start: 2018-10-30 — End: 2019-10-07

## 2018-11-02 ENCOUNTER — Encounter: Payer: Self-pay | Admitting: Otolaryngology

## 2018-11-03 ENCOUNTER — Ambulatory Visit: Payer: Self-pay | Admitting: Orthopedic Surgery

## 2018-11-11 ENCOUNTER — Encounter: Payer: Self-pay | Admitting: Otolaryngology

## 2018-11-11 ENCOUNTER — Ambulatory Visit: Payer: Self-pay | Admitting: Otolaryngology

## 2018-11-11 ENCOUNTER — Ambulatory Visit
Admission: RE | Admit: 2018-11-11 | Discharge: 2018-11-11 | Disposition: A | Discharge status: Home / Self Care | Payer: Self-pay | Source: Ambulatory Visit | Attending: Otolaryngology | Admitting: Otolaryngology

## 2018-11-11 VITALS — BP 133/82 | HR 85 | Ht 67.0 in | Wt 160.0 lb

## 2018-11-11 DIAGNOSIS — T148XXA Other injury of unspecified body region, initial encounter: Secondary | ICD-10-CM | POA: Insufficient documentation

## 2018-11-11 DIAGNOSIS — Z4781 Encounter for orthopedic aftercare following surgical amputation: Secondary | ICD-10-CM

## 2018-11-11 DIAGNOSIS — Z89022 Acquired absence of left finger(s): Secondary | ICD-10-CM

## 2018-11-11 NOTE — Progress Notes (Signed)
SURGERY DATE: 10/28/2018   OPERATIVE PROCEDURE: Amputation revision left middle finger     Marcus Montoya    Subjective:      Patient is here today for initial post operative evaluation. Pain is minimal. Changes in sensation: none. No consitutional signs of illness. Has been in soft dressing.     Objective:    Vital signs: Recorded and reviewed.   Left hand:   Middle finger amputated mid-way through distal phalanx, dry eschar and a few residual chromics intact. There is red granulation tissue ulnar aspect distal phalanx and surrounding skin slightly macerated. Minimal motion at the DIP, he does have full extension of the PIP, flexes to 90 degrees.  Sensation intact radial and ulnar aspect of digit, rest of digits with good ROM. Fingers are WWP, brisk capillary refill.       Imaging:   Radiographs today personally reviewed, shows partial amputation of the distal phalanx.     Assessment/ Plan:    S/p left middle finger amputation revision 10/28/2018     Satisfactory initial postop course. Education regarding wound care and activity provided. Signs and symptoms of infection reviewed, no concerns today. May wash and pat dry, scar massage instructed.  OTC analgesics as needed. He was shown ROM exercises for the DIP and PIP.  Follow up 6 weeks.     Orders Next Visit: Radiographs

## 2018-11-26 ENCOUNTER — Ambulatory Visit: Payer: Self-pay | Admitting: Orthopedic Surgery

## 2019-03-02 ENCOUNTER — Ambulatory Visit
Admission: RE | Admit: 2019-03-02 | Discharge: 2019-03-02 | Disposition: A | Discharge status: Home / Self Care | Payer: Self-pay | Source: Ambulatory Visit | Attending: Emergency Medicine | Admitting: Emergency Medicine

## 2019-03-02 ENCOUNTER — Other Ambulatory Visit: Payer: Self-pay | Admitting: Emergency Medicine

## 2019-03-02 DIAGNOSIS — R079 Chest pain, unspecified: Secondary | ICD-10-CM

## 2019-03-02 DIAGNOSIS — R05 Cough: Secondary | ICD-10-CM | POA: Insufficient documentation

## 2019-03-02 DIAGNOSIS — R059 Cough, unspecified: Secondary | ICD-10-CM

## 2019-03-03 ENCOUNTER — Other Ambulatory Visit
Admission: RE | Admit: 2019-03-03 | Discharge: 2019-03-03 | Disposition: A | Discharge status: Home / Self Care | Payer: Self-pay | Source: Ambulatory Visit | Attending: Emergency Medicine | Admitting: Emergency Medicine

## 2019-03-03 DIAGNOSIS — E039 Hypothyroidism, unspecified: Secondary | ICD-10-CM | POA: Insufficient documentation

## 2019-03-03 DIAGNOSIS — E785 Hyperlipidemia, unspecified: Secondary | ICD-10-CM | POA: Insufficient documentation

## 2019-03-03 DIAGNOSIS — E519 Thiamine deficiency, unspecified: Secondary | ICD-10-CM | POA: Insufficient documentation

## 2019-03-03 DIAGNOSIS — Z125 Encounter for screening for malignant neoplasm of prostate: Secondary | ICD-10-CM | POA: Insufficient documentation

## 2019-03-03 DIAGNOSIS — D649 Anemia, unspecified: Secondary | ICD-10-CM | POA: Insufficient documentation

## 2019-03-03 DIAGNOSIS — Z0000 Encounter for general adult medical examination without abnormal findings: Secondary | ICD-10-CM | POA: Insufficient documentation

## 2019-03-03 DIAGNOSIS — D559 Anemia due to enzyme disorder, unspecified: Secondary | ICD-10-CM | POA: Insufficient documentation

## 2019-03-03 LAB — FOLATE: Folate: 4 ng/mL — AB (ref >=4.6)

## 2019-03-03 LAB — BASIC METABOLIC PANEL
Anion Gap: 11 (ref 7–16)
CO2: 23 mmol/L (ref 20–28)
Calcium: 9.5 mg/dL (ref 8.6–10.2)
Chloride: 103 mmol/L (ref 96–108)
Creatinine: 0.88 mg/dL (ref 0.67–1.17)
GFR,Black: 117 *
GFR,Caucasian: 101 *
Glucose: 114 mg/dL — ABNORMAL HIGH (ref 60–99)
Lab: 19 mg/dL (ref 6–20)
Potassium: 4.2 mmol/L (ref 3.3–5.1)
Sodium: 137 mmol/L (ref 133–145)

## 2019-03-03 LAB — HEPATIC FUNCTION PANEL
ALT: 21 U/L (ref 0–50)
AST: 21 U/L (ref 0–50)
Albumin: 4.5 g/dL (ref 3.5–5.2)
Alk Phos: 116 U/L (ref 40–130)
Bilirubin,Direct: <0.2 mg/dL (ref 0.0–0.3)
Bilirubin,Total: 0.3 mg/dL (ref 0.0–1.2)
Total Protein: 6.8 g/dL (ref 6.3–7.7)

## 2019-03-03 LAB — LIPID PANEL
Chol/HDL Ratio: 5
Cholesterol: 229 mg/dL — AB
HDL: 46 mg/dL (ref 40–60)
LDL Calculated: 147 mg/dL — AB
Non HDL Cholesterol: 183 mg/dL
Triglycerides: 178 mg/dL — AB

## 2019-03-03 LAB — CBC
Hematocrit: 47 % (ref 40–51)
Hemoglobin: 16.2 g/dL (ref 13.7–17.5)
MCH: 32 pg/cell (ref 26–32)
MCHC: 35 g/dL (ref 32–37)
MCV: 92 fL (ref 79–92)
Platelets: 235 10*3/uL (ref 150–330)
RBC: 5.1 MIL/uL (ref 4.6–6.1)
RDW: 14.5 % — ABNORMAL HIGH (ref 11.6–14.4)
WBC: 8.7 10*3/uL (ref 4.2–9.1)

## 2019-03-03 LAB — HEMOGLOBIN A1C: Hemoglobin A1C: 5.7 % — ABNORMAL HIGH

## 2019-03-03 LAB — FERRITIN: Ferritin: 146 ng/mL (ref 20–250)

## 2019-03-03 LAB — VITAMIN B12: Vitamin B12: 412 pg/mL (ref 232–1245)

## 2019-03-03 LAB — TSH: TSH: 0.9 u[IU]/mL (ref 0.27–4.20)

## 2019-03-03 LAB — TIBC
Iron: 85 ug/dL (ref 45–170)
TIBC: 350 ug/dL (ref 250–450)
Transferrin Saturation: 24 % (ref 20–55)

## 2019-03-03 LAB — VITAMIN D: 25-OH Vit Total: 35 ng/mL (ref 30–60)

## 2019-03-03 LAB — PSA (EFF.4-2010): PSA (eff. 4-2010): 0.66 ng/mL (ref 0.00–4.00)

## 2019-03-03 LAB — NT-PRO BNP: NT-pro BNP: <50 pg/mL (ref 0–450)

## 2019-03-04 LAB — AEROBIC CULTURE: Aerobic Culture: 0

## 2019-03-26 ENCOUNTER — Inpatient Hospital Stay: Admission: RE | Admit: 2019-03-26 | Payer: Self-pay | Source: Ambulatory Visit

## 2019-10-07 ENCOUNTER — Ambulatory Visit
Admission: AD | Admit: 2019-10-07 | Discharge: 2019-10-07 | Disposition: A | Discharge status: Home / Self Care | Payer: Self-pay | Source: Ambulatory Visit | Attending: Emergency Medicine | Admitting: Emergency Medicine

## 2019-10-07 DIAGNOSIS — I808 Phlebitis and thrombophlebitis of other sites: Secondary | ICD-10-CM | POA: Insufficient documentation

## 2019-10-07 NOTE — Discharge Instructions (Signed)
You have a superificial phlebitis to the vein from your IV. You may use some warm compresses and take tylenol for the discomfort  There is no evidence a deeper clot at this time, If you start having increased pain and swelling to that arm you should go to the ER for an ultrasound

## 2019-10-07 NOTE — ED Triage Notes (Signed)
Pt with right AC bruising and firmness at IV site, pt had IV placed on Friday, firmness noted along the vein pulses present,        Triage Note   Gavin Pound, RN

## 2019-10-07 NOTE — UC Provider Note (Signed)
History     Chief Complaint   Patient presents with    Arm Pain     pt had IV placed last Tuesday right AC, pt noted pain in, pt with brusing and firmness at IV site      Patient is a 49 year old male  Here today for evaluation of some pain and firmness along vein in right arm, HE was in the hospital about a week ago and had an IV > He did receive several medication through the IV and said the IV hurt while in place, He says today started noticed some discomfort and feel some firmness along vein. He says also has noticed some bruising, He has some chronic chest pains but has not noticed any increase in shortness of breath or worsening chest pain, THe arm is not swollen          Medical/Surgical/Family History     History reviewed. No pertinent past medical history.     Patient Active Problem List   Diagnosis Code    Chest pain R07.9    Tobacco abuse Z72.0    S/p index amp revision 10/28/18 T14.8XXA            Past Surgical History:   Procedure Laterality Date    PR AMPUTATION FINGER/THUMB Left 10/28/2018    Procedure: AMPUTATION, LEFT MIDDLE FINGER;  Surgeon: Edgardo Roys, DO;  Location: [facility];  Service: Orthopedics     History reviewed. No pertinent family history.       Social History     Tobacco Use    Smoking status: Current Every Day Smoker     Packs/day: 1.00     Types: Cigarettes    Smokeless tobacco: Never Used   Substance Use Topics    Alcohol use: Yes     Comment: 2 drinks per week    Drug use: No     Living Situation     Questions Responses    Patient lives with Significant Other    Homeless No    Caregiver for other family member No    External Services None    Employment Employed    Domestic Violence Risk No                Review of Systems   Review of Systems   Constitutional: Negative for chills and fever.   Respiratory: Negative for shortness of breath.    Cardiovascular: Positive for chest pain (chronic). Negative for leg swelling.   Musculoskeletal: Negative for joint swelling.        Physical Exam   Triage Vitals  Triage Start: Start, (10/07/19 2041)   First Recorded BP: 124/83, Resp: 18, Temp: 36.4 C (97.5 F), Temp src: TEMPORAL Oxygen Therapy SpO2: 98 %, O2 Device: None (Room air), Heart Rate: 102, (10/07/19 2043)  .  First Pain Reported  0-10 Scale: 0, (10/07/19 2043)       Physical Exam  Vitals and nursing note reviewed.   Constitutional:       General: He is not in acute distress.     Appearance: Normal appearance. He is not ill-appearing.   Cardiovascular:      Rate and Rhythm: Tachycardia present.      Pulses: Normal pulses.   Pulmonary:      Effort: Pulmonary effort is normal.      Breath sounds: Normal breath sounds.   Musculoskeletal:      Right upper arm: Tenderness (tender palpable vein in upper arm where  IVI was prior) present. No swelling or deformity.   Neurological:      Mental Status: He is alert.          Medical Decision Making        Initial Evaluation:  ED First Provider Contact     Date/Time Event User Comments    10/07/19 2039 ED First Provider Contact Summerlynn Glauser Initial Face to Face Provider Contact          Patient was seen on: 10/07/2019        Assessment:  48 y.o.male comes to the [facility] with tender swollen vein in right upper arm    Differential Diagnosis includes:  Thrombophlebitis  Cellulitis  DVT ( low suspicion)    Plan: Advise warm compresses, may take tylenol for pain, HE is advised if increased pain or swelling will need to go to ER for [place]        Orders Placed This Encounter   No orders placed during this encounter.       No results found for this or any previous visit (from the past 24 hour(s)).        Final Diagnosis    ICD-10-CM ICD-9-CM   1. Superficial phlebitis of arm  I80.8 451.82       Encourage fluids, encourage rest, good hand hygiene.    Use over the counter medications as discussed.    Please start the new medications as below:    Current Discharge Medication List          Please follow up with your physician as  below:        Thank you Orpha Bur for coming to [facility] for your health care concerns.    If your condition changes and/or worsens please follow up with her primary doctor and/or return to the urgent care center.    If short of breath, chest pains or any other concerns please report to the emergency room.    In the event of an Emergency dial 911.    Final Diagnosis  Final diagnoses:   [I80.8] Superficial phlebitis of arm (Primary)         Delorise Jackson, MD              Delorise Jackson, MD  10/07/19 2101

## 2019-12-30 ENCOUNTER — Other Ambulatory Visit
Admission: RE | Admit: 2019-12-30 | Discharge: 2019-12-30 | Disposition: A | Discharge status: Home / Self Care | Payer: Self-pay | Source: Ambulatory Visit | Attending: Emergency Medicine | Admitting: Emergency Medicine

## 2019-12-30 DIAGNOSIS — Z125 Encounter for screening for malignant neoplasm of prostate: Secondary | ICD-10-CM | POA: Insufficient documentation

## 2019-12-30 DIAGNOSIS — E559 Vitamin D deficiency, unspecified: Secondary | ICD-10-CM | POA: Insufficient documentation

## 2019-12-30 DIAGNOSIS — E538 Deficiency of other specified B group vitamins: Secondary | ICD-10-CM | POA: Insufficient documentation

## 2019-12-30 LAB — BASIC METABOLIC PANEL
Anion Gap: 10 (ref 7–16)
CO2: 25 mmol/L (ref 20–28)
Calcium: 9.8 mg/dL (ref 8.6–10.2)
Chloride: 103 mmol/L (ref 96–108)
Creatinine: 0.94 mg/dL (ref 0.67–1.17)
GFR,Black: 109 *
GFR,Caucasian: 95 *
Glucose: 118 mg/dL — ABNORMAL HIGH (ref 60–99)
Lab: 16 mg/dL (ref 6–20)
Potassium: 4.8 mmol/L (ref 3.3–5.1)
Sodium: 138 mmol/L (ref 133–145)

## 2019-12-30 LAB — CBC
Hematocrit: 45 % (ref 40–51)
Hemoglobin: 15.3 g/dL (ref 13.7–17.5)
MCH: 31 pg (ref 26–32)
MCHC: 34 g/dL (ref 32–37)
MCV: 91 fL (ref 79–92)
Platelets: 243 10*3/uL (ref 150–330)
RBC: 4.9 MIL/uL (ref 4.6–6.1)
RDW: 13.9 % (ref 11.6–14.4)
WBC: 6.5 10*3/uL (ref 4.2–9.1)

## 2019-12-30 LAB — VITAMIN D: 25-OH Vit Total: 36 ng/mL (ref 30–60)

## 2019-12-30 LAB — PSA (EFF.4-2010): PSA (eff. 4-2010): 0.44 ng/mL (ref 0.00–4.00)

## 2019-12-30 LAB — TSH: TSH: 0.7 u[IU]/mL (ref 0.27–4.20)

## 2019-12-30 LAB — HEPATIC FUNCTION PANEL
ALT: 13 U/L (ref 0–50)
AST: 12 U/L (ref 0–50)
Albumin: 4.6 g/dL (ref 3.5–5.2)
Alk Phos: 89 U/L (ref 40–130)
Bilirubin,Direct: <0.2 mg/dL (ref 0.0–0.3)
Bilirubin,Total: <0.2 mg/dL (ref 0.0–1.2)
Total Protein: 6.9 g/dL (ref 6.3–7.7)

## 2019-12-30 LAB — URINALYSIS REFLEX TO CULTURE
Blood,UA: NEGATIVE
Glucose,UA: NEGATIVE mg/dL
Ketones, UA: NEGATIVE
Leuk Esterase,UA: NEGATIVE
Nitrite,UA: NEGATIVE
Protein,UA: NEGATIVE mg/dL
Specific Gravity,UA: 1.023 (ref 1.002–1.030)
pH,UA: 6 (ref 5.0–8.0)

## 2019-12-30 LAB — LIPID PANEL
Chol/HDL Ratio: 5.5
Cholesterol: 192 mg/dL
HDL: 35 mg/dL — ABNORMAL LOW (ref 40–60)
LDL Calculated: 130 mg/dL — AB
Non HDL Cholesterol: 157 mg/dL
Triglycerides: 134 mg/dL

## 2019-12-30 LAB — TIBC
Iron: 72 ug/dL (ref 45–170)
TIBC: 301 ug/dL (ref 250–450)
Transferrin Saturation: 24 % (ref 20–55)

## 2019-12-30 LAB — FERRITIN: Ferritin: 142 ng/mL (ref 20–250)

## 2019-12-30 LAB — SEDIMENTATION RATE, AUTOMATED: Sedimentation Rate: 4 mm/hr (ref 0–15)

## 2019-12-30 LAB — LIPASE: Lipase: 24 U/L (ref 13–60)

## 2019-12-30 LAB — AMYLASE: Amylase: 62 U/L (ref 28–100)

## 2019-12-30 LAB — VITAMIN B12: Vitamin B12: 493 pg/mL (ref 232–1245)

## 2019-12-30 LAB — CRP: CRP: <3 mg/L (ref 0–8)

## 2019-12-31 LAB — HEMOGLOBIN A1C: Hemoglobin A1C: 5.5 %

## 2022-09-26 IMAGING — CT CT BRAIN WO CONTRAST
3 series · 14 of 47 positions shown, 16 images · non-contrast
Comparison: CT cervical spine study from same day.

Images Obtained from [HOSPITAL] Imaging
HISTORY: Chronic post-traumatic headache, not intractable, Cervicalgia,- Weakness Other specified injuries of head, initial encounter,
TECHNIQUE: CT images were obtained from the skull base to the vertex without the administration of intravenous contrast. Coronal and sagittal reformation images obtained. Dose reduction technique
used: Automated exposure control and adjustment of the mA and/or kV according to patient size. CT Studies and Cardiac Nuclear Medicine Studies in last 12-months = 0

[Series 2: head w/o soft tissue · axial · non-contrast · 0.35mm/px · z∈[-611,-473]mm · 8 of 54 slices shown, 10 images]
[im 4/54  brain]
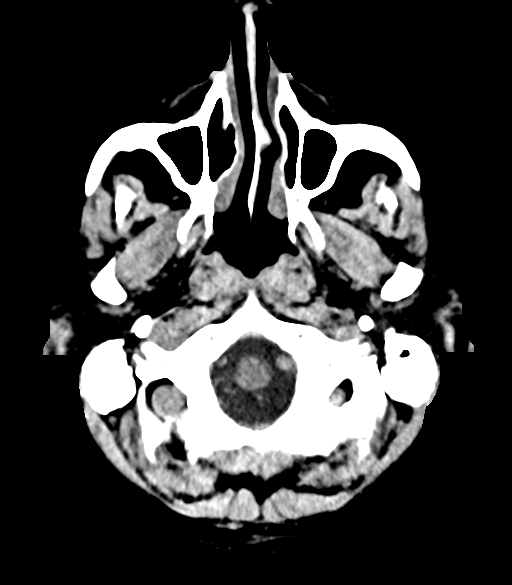
[im 4/54  bone]
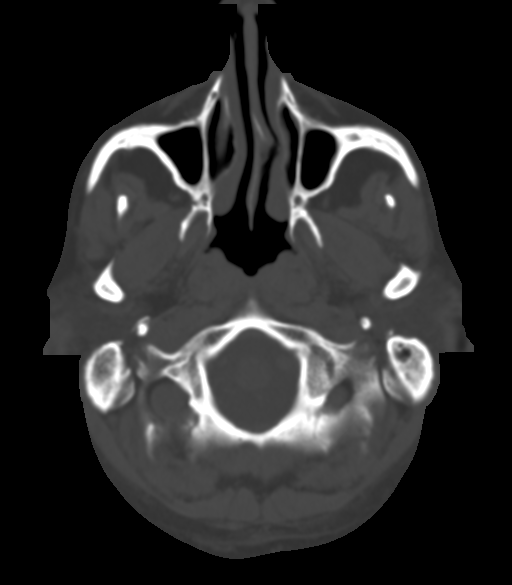
[im 11/54  brain]
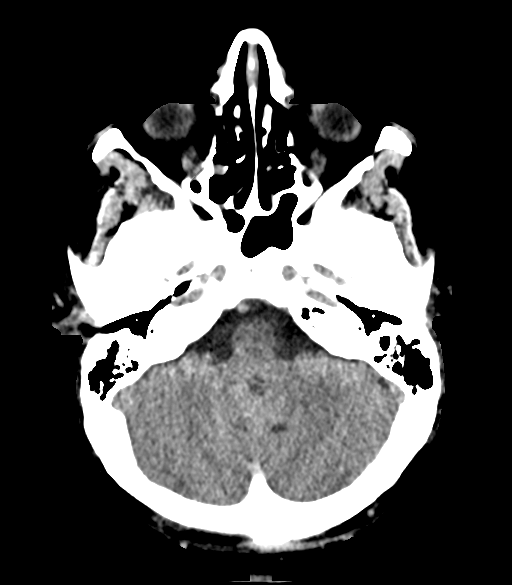
[im 17/54  brain]
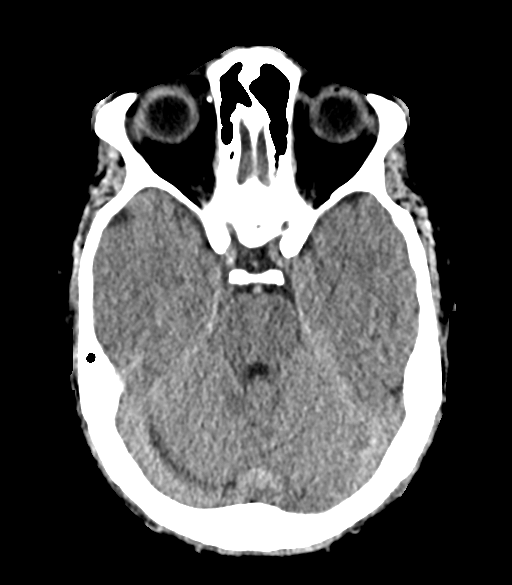
[im 24/54  brain]
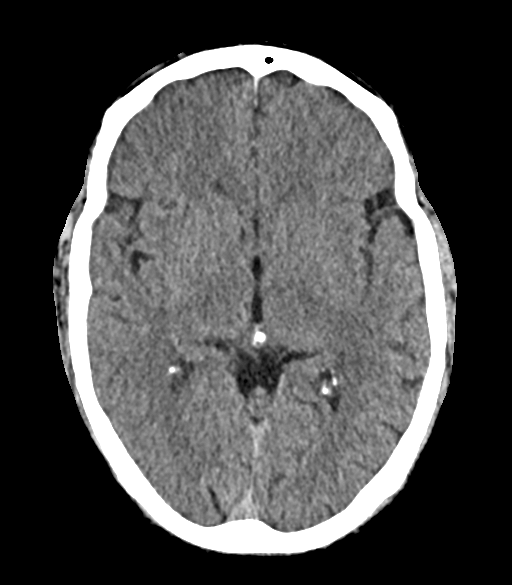
[im 30/54  brain]
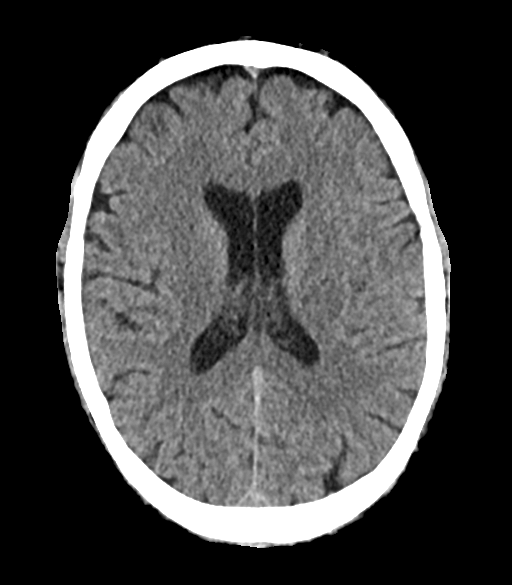
[im 30/54  bone]
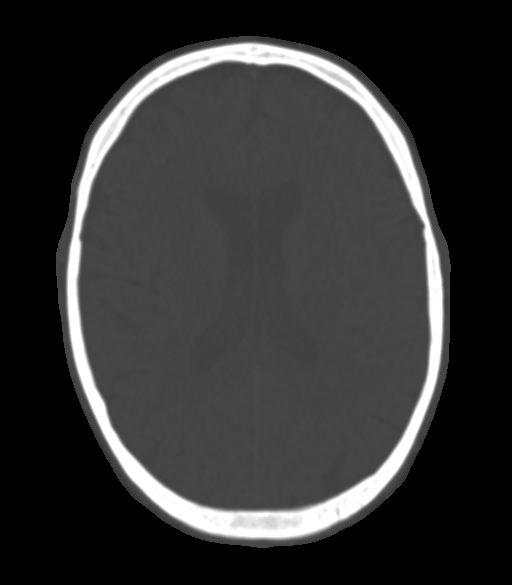
[im 37/54  brain]
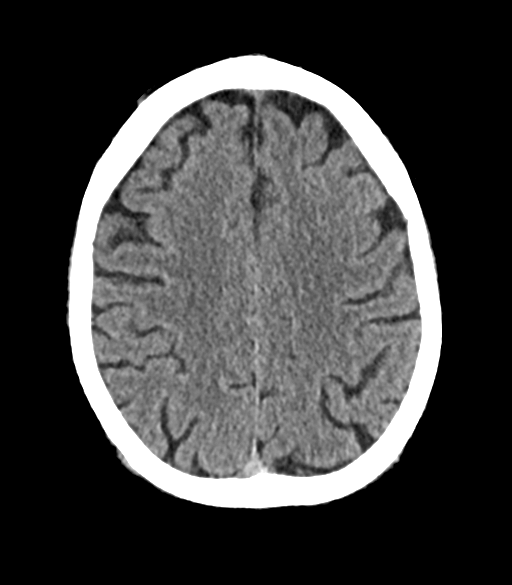
[im 43/54  brain]
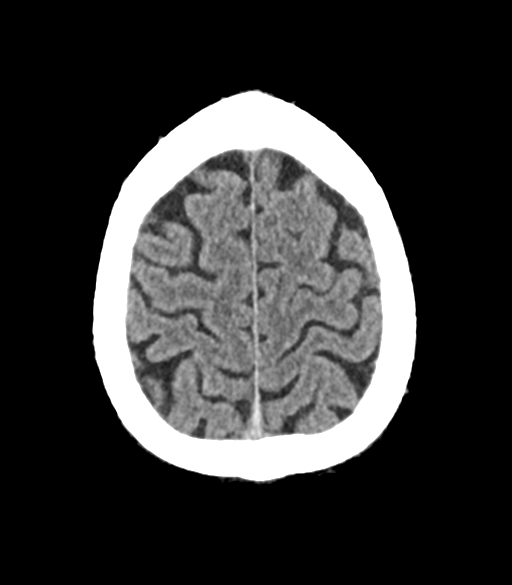
[im 50/54  brain]
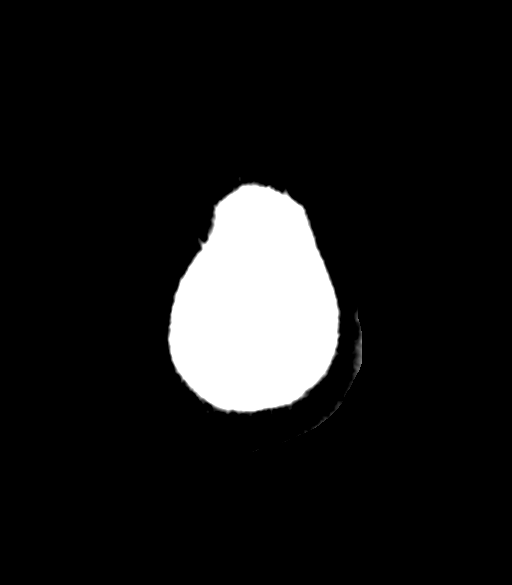

[Series 6: coronal · coronal · 0.32mm/px · 3 of 68 slices shown]
[im 23/68  brain]
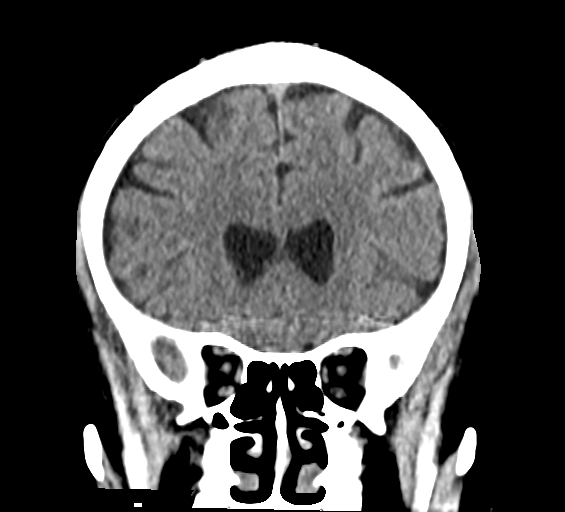
[im 30/68  brain]
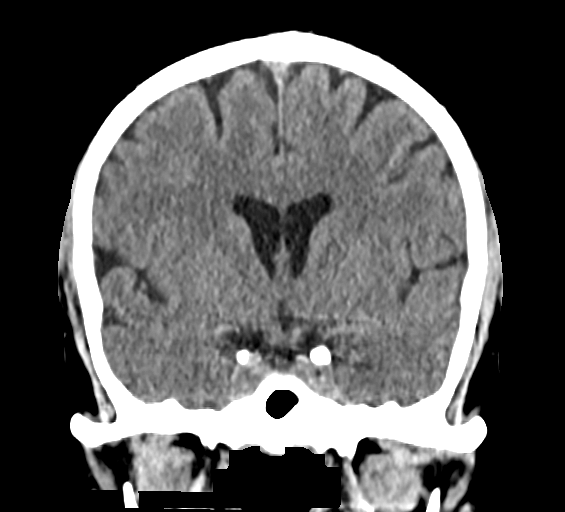
[im 38/68  brain]
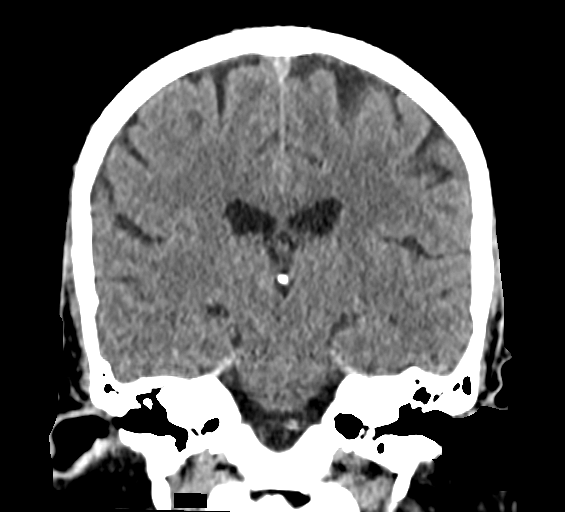

[Series 8: sagittal · sagittal · 0.32mm/px · 3 of 59 slices shown]
[im 20/59  brain]
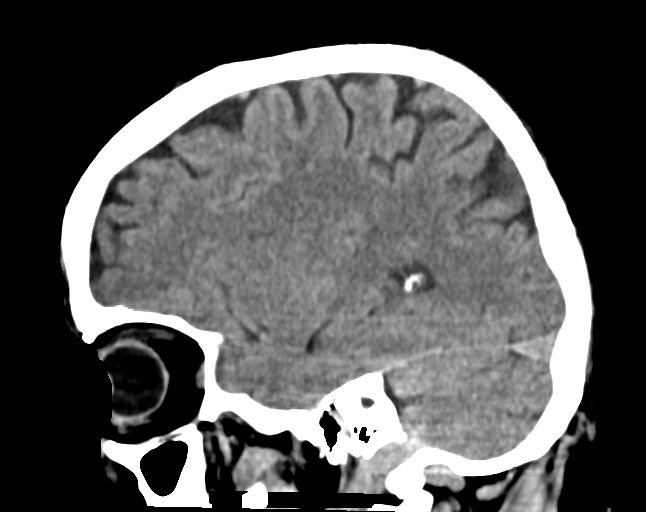
[im 30/59  brain]
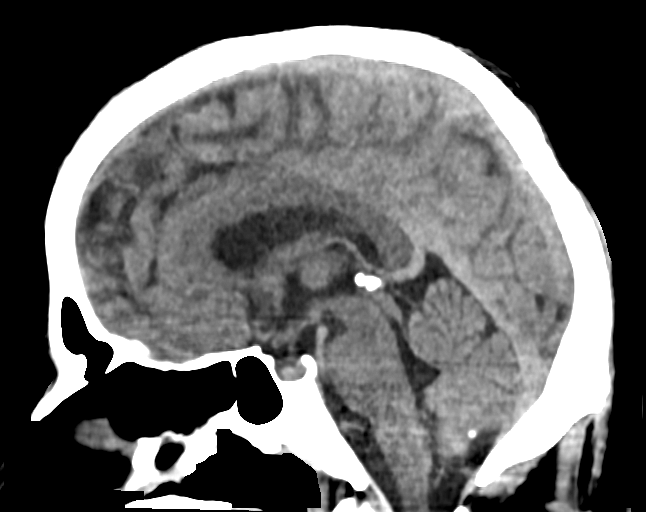
[im 39/59  brain]
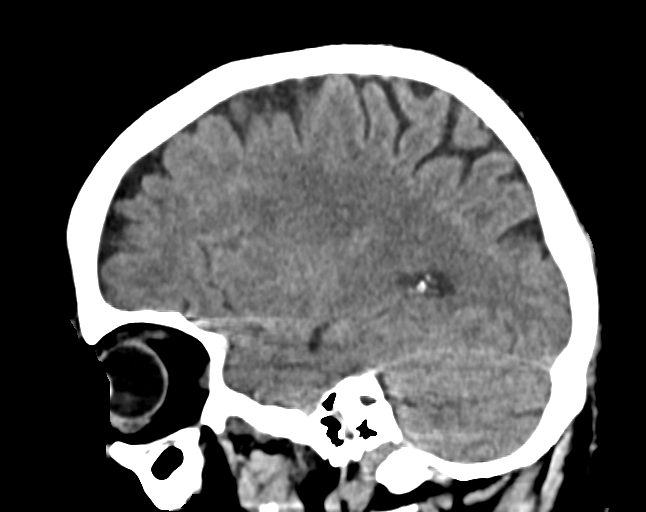

[14 of 47 positions shown; findings below may reference images not displayed]

FINDINGS: There is mild prominence of ventricles and cortical sulci. There is no abnormal intra-axial attenuation. Midline structures are normal. There is no mass lesion or midline shift. No
extradural fluid collection or hematoma is seen.
Mild-to-moderate mucosal thickening of ethmoid air cells and maxillary sinuses seen. Deviation of nasal septum to the left with small apex spur seen. Mild wall sclerosis of maxillary sinuses is seen.
The rest of the orbits, paranasal sinuses, mastoid air cells and skull are normal.
Coronal and sagittal reformation images confirm above findings.
IMPRESSION: 1.  Mild cerebral atrophy or involutional changes seen.
2.  No acute intracranial injury or skull fracture.
3.  Chronic paranasal sinusitis seen.
4.  Deviation of nasal septum to the left with small apex spur seen.
Total radiation dose to patient is CTDIvol 36.30 mGy and DLP 693.00 mGy-cm.

## 2022-10-11 IMAGING — MR MRI CSPINE WO CONTRAST
4 of 5 series · 18 of 48 positions shown · non-contrast
Comparison: CT cervical spine 09/26/22

Images Obtained from [HOSPITAL] Imaging
HISTORY: 51 years-old Male with cervical disc degeneration status post fall. Bilateral neck and arm pain with numbness and tingling in both hands.
TECHNIQUE: Multiplanar, multisequential MRI of the cervical spine without intravenous contrast was performed.

[Series 5: t2_sag · sagittal · 3.0mm · 0.35mm/px · 7 of 15 slices shown]
[im 1/15]
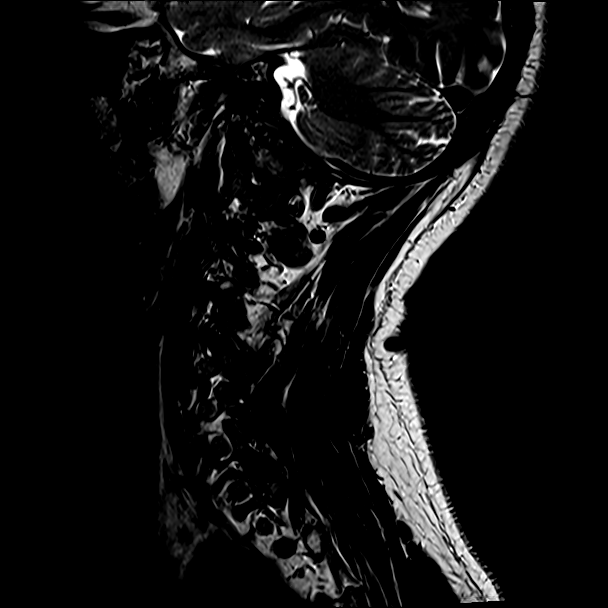
[im 3/15]
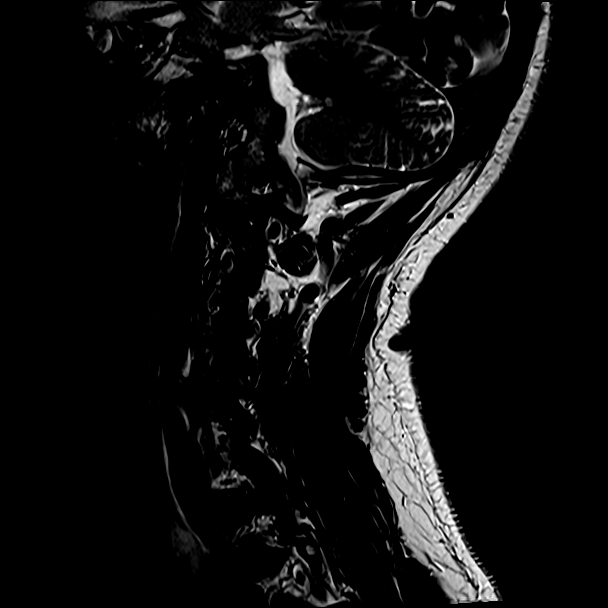
[im 5/15]
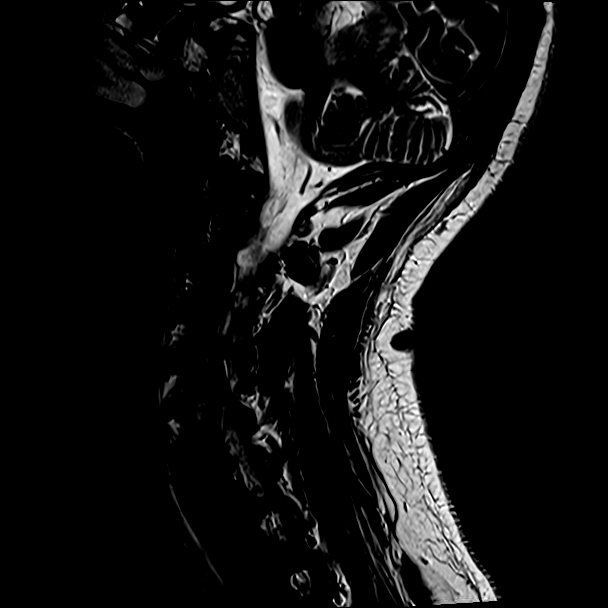
[im 8/15]
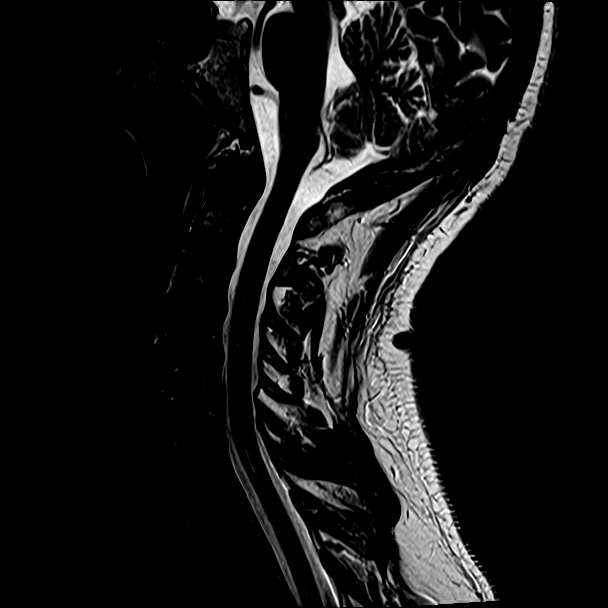
[im 10/15]
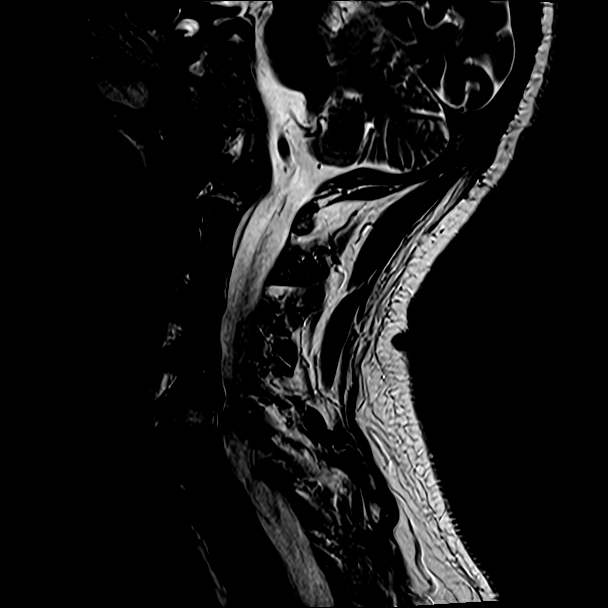
[im 12/15]
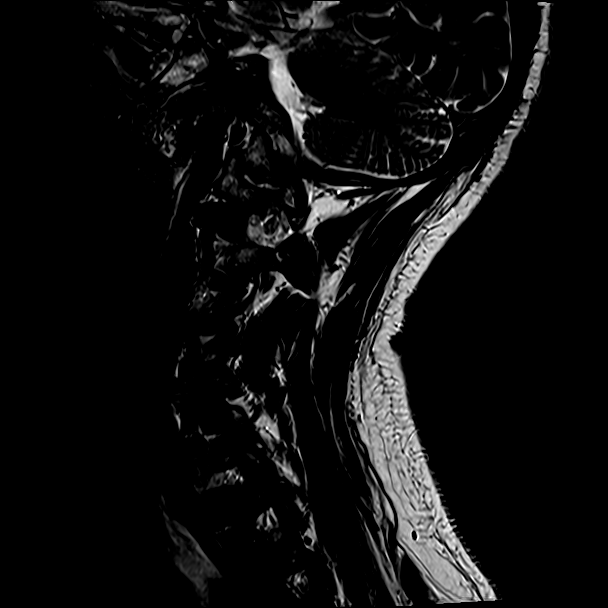
[im 15/15]
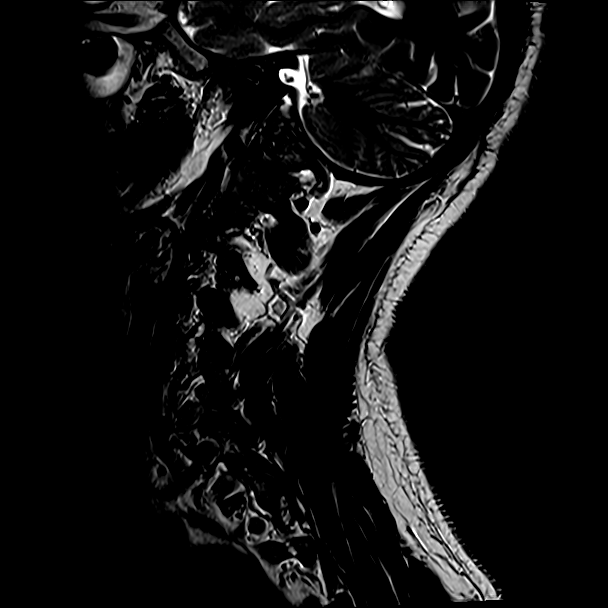

[Series 6: t1_sag · sagittal · 3.0mm · 0.41mm/px · 5 of 15 slices shown]
[im 1/15]
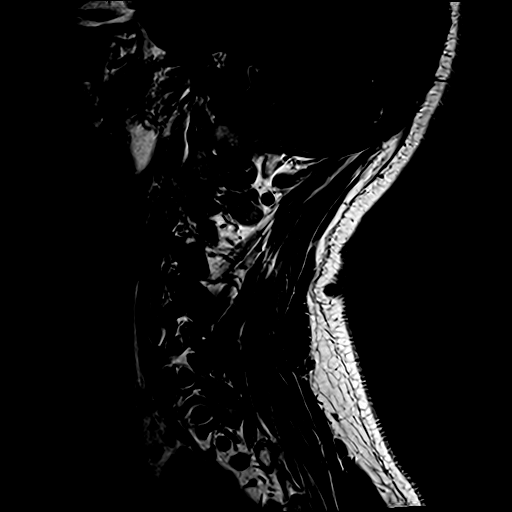
[im 3/15]
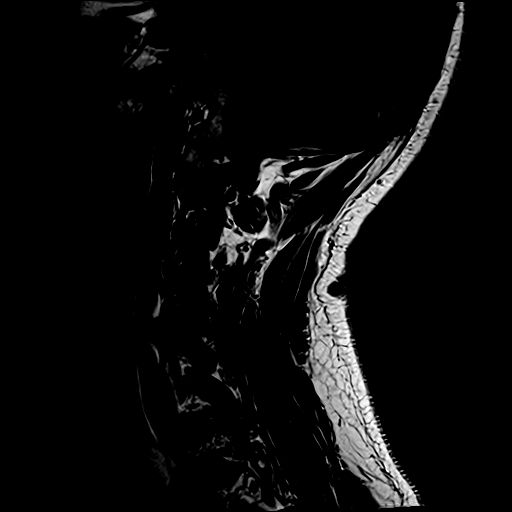
[im 5/15]
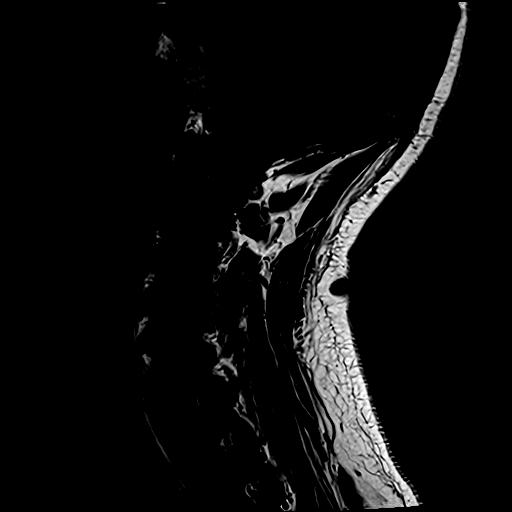
[im 8/15]
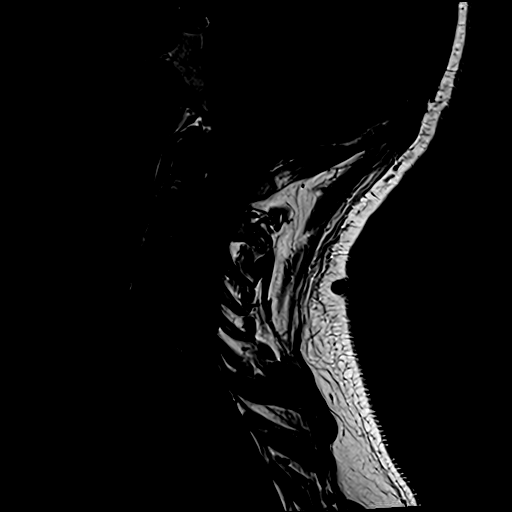
[im 12/15]
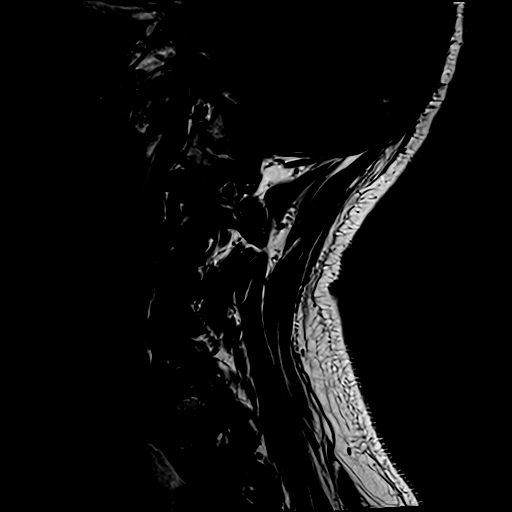

[Series 7: ir_sag · sagittal · 3.0mm · 0.44mm/px · 3 of 15 slices shown]
[im 3/15]
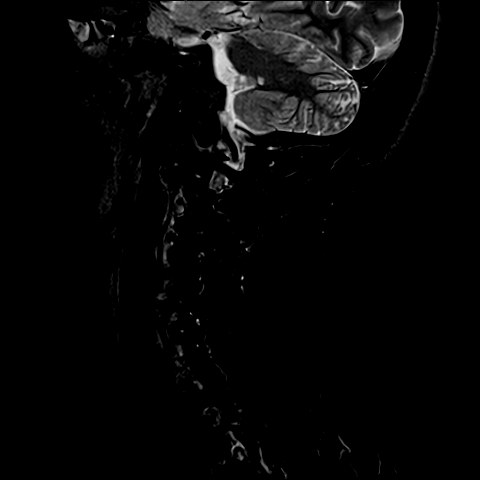
[im 9/15]
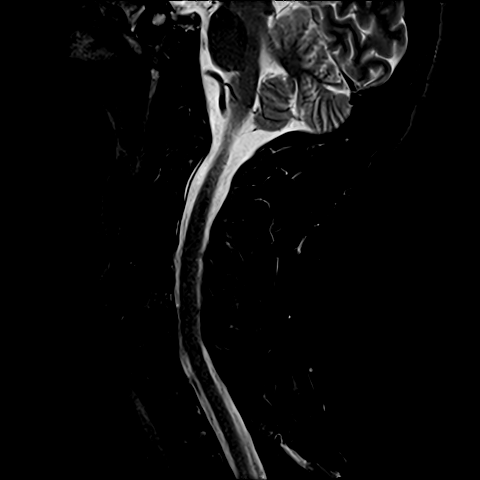
[im 15/15]
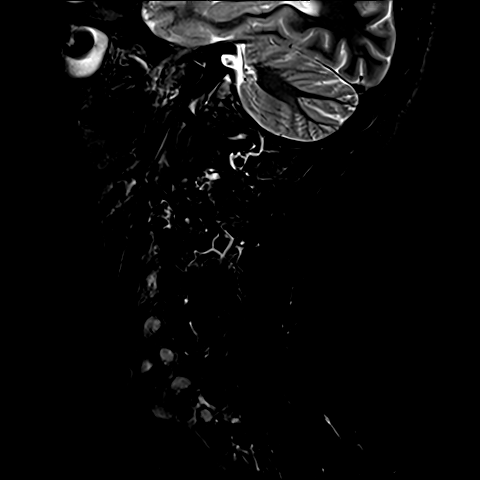

[Series 8: t2_medic_axial · axial · 3.0mm · 0.49mm/px · z∈[-48,+32]mm · 3 of 34 slices shown]
[im 6/34]
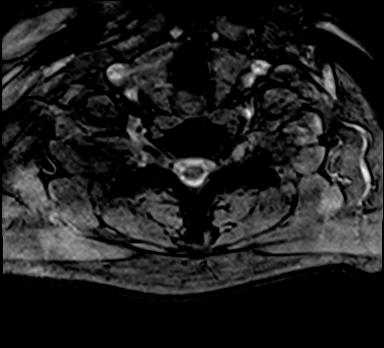
[im 18/34]
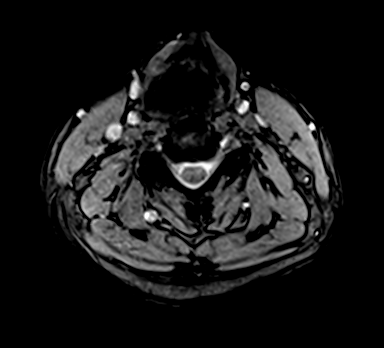
[im 28/34]
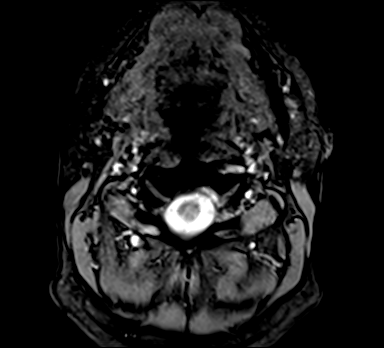

[18 of 48 positions shown; findings below may reference images not displayed]

FINDINGS: Alignment: No subluxation.
Vertebrae and discs: Multilevel degenerative disc disease and endplate change, most pronounced at C5-C6.
Marrow: No suspicious osseous lesion or acute fracture.
Cord: Minimal prominence of the central canal in the lower cervical cord measuring 1 mm in diameter, likely benign/anatomic variant.
C2-3: Mild disc osteophyte complex. Mild uncovertebral and facet arthrosis. Mild canal stenosis. No significant neural foraminal stenosis.
C3-4: Mild disc osteophyte complex. Moderate uncovertebral and mild facet arthrosis. Mild canal and moderate right neural foraminal stenosis.
C4-5: Mild disc osteophyte complex. Mild uncovertebral, moderate right and mild left facet arthrosis. Mild canal, moderate to severe right and mild left neural foraminal stenosis.
C5-6: Mild disc osteophyte complex. Severe uncovertebral and mild facet arthrosis. Mild canal and severe bilateral neural foraminal stenosis.
C6-7: Mild uncovertebral, moderate left and mild right facet arthrosis. No significant canal stenosis. Mild left neural foraminal stenosis.
C7-T1: Mild facet arthrosis. No significant canal or neural foraminal stenosis.
The paravertebral soft tissues are unremarkable.
IMPRESSION: 1.  Moderate right C3-C4 neural foraminal stenosis.
2.  Moderate to severe right C4-C5 neural foraminal stenosis.
3.  Severe bilateral C5-C6 neural foraminal stenosis.
4.  No high-grade canal stenosis.
5.  Minimal prominence of central canal in the lower cervical cord, likely benign/anatomic variant.
